# Patient Record
Sex: Female | Born: 1935 | Race: White | Hispanic: No | Marital: Married | Smoking: Never smoker
Health system: Southern US, Community
[De-identification: ages and names within clinical notes are randomized; demographics above are authoritative.]

## PROBLEM LIST (undated history)

## (undated) DIAGNOSIS — M797 Fibromyalgia: Secondary | ICD-10-CM

## (undated) DIAGNOSIS — Z87442 Personal history of urinary calculi: Secondary | ICD-10-CM

## (undated) HISTORY — PX: BREAST SURGERY: SHX581

## (undated) HISTORY — PX: COLON SURGERY: SHX602

## (undated) HISTORY — PX: ABDOMINAL HYSTERECTOMY: SHX81

---

## 2002-05-12 ENCOUNTER — Other Ambulatory Visit: Admission: RE | Admit: 2002-05-12 | Discharge: 2002-05-12 | Payer: Self-pay | Admitting: Family Medicine

## 2004-01-28 ENCOUNTER — Emergency Department (HOSPITAL_COMMUNITY): Admission: EM | Admit: 2004-01-28 | Discharge: 2004-01-28 | Payer: Self-pay | Admitting: Family Medicine

## 2004-05-16 ENCOUNTER — Other Ambulatory Visit: Admission: RE | Admit: 2004-05-16 | Discharge: 2004-05-16 | Payer: Self-pay | Admitting: Family Medicine

## 2005-01-10 ENCOUNTER — Ambulatory Visit (HOSPITAL_COMMUNITY): Admission: RE | Admit: 2005-01-10 | Discharge: 2005-01-10 | Payer: Self-pay | Admitting: Family Medicine

## 2009-08-10 ENCOUNTER — Other Ambulatory Visit: Admission: RE | Admit: 2009-08-10 | Discharge: 2009-08-10 | Payer: Self-pay | Admitting: Family Medicine

## 2014-04-19 ENCOUNTER — Emergency Department (HOSPITAL_COMMUNITY): Payer: Medicare Other

## 2014-04-19 ENCOUNTER — Encounter (HOSPITAL_COMMUNITY): Payer: Self-pay | Admitting: Emergency Medicine

## 2014-04-19 ENCOUNTER — Emergency Department (HOSPITAL_COMMUNITY)
Admission: EM | Admit: 2014-04-19 | Discharge: 2014-04-19 | Disposition: A | Discharge status: Home / Self Care | Payer: Medicare Other | Attending: Emergency Medicine | Admitting: Emergency Medicine

## 2014-04-19 DIAGNOSIS — Z79899 Other long term (current) drug therapy: Secondary | ICD-10-CM | POA: Diagnosis not present

## 2014-04-19 DIAGNOSIS — Z8739 Personal history of other diseases of the musculoskeletal system and connective tissue: Secondary | ICD-10-CM | POA: Insufficient documentation

## 2014-04-19 DIAGNOSIS — R0789 Other chest pain: Secondary | ICD-10-CM | POA: Diagnosis not present

## 2014-04-19 DIAGNOSIS — F411 Generalized anxiety disorder: Secondary | ICD-10-CM | POA: Insufficient documentation

## 2014-04-19 DIAGNOSIS — F418 Other specified anxiety disorders: Secondary | ICD-10-CM

## 2014-04-19 HISTORY — DX: Fibromyalgia: M79.7

## 2014-04-19 LAB — BASIC METABOLIC PANEL
Anion gap: 16 — ABNORMAL HIGH (ref 5–15)
BUN: 14 mg/dL (ref 6–23)
CALCIUM: 10.1 mg/dL (ref 8.4–10.5)
CHLORIDE: 103 meq/L (ref 96–112)
CO2: 26 meq/L (ref 19–32)
Creatinine, Ser: 0.92 mg/dL (ref 0.50–1.10)
GFR calc Af Amer: 68 mL/min — ABNORMAL LOW (ref >90)
GFR, EST NON AFRICAN AMERICAN: 59 mL/min — AB (ref >90)
GLUCOSE: 100 mg/dL — AB (ref 70–99)
POTASSIUM: 4.3 meq/L (ref 3.7–5.3)
Sodium: 145 mEq/L (ref 137–147)

## 2014-04-19 LAB — I-STAT TROPONIN, ED: Troponin i, poc: 0 ng/mL (ref 0.00–0.08)

## 2014-04-19 LAB — CBC
HEMATOCRIT: 41 % (ref 36.0–46.0)
Hemoglobin: 13.7 g/dL (ref 12.0–15.0)
MCH: 30.7 pg (ref 26.0–34.0)
MCHC: 33.4 g/dL (ref 30.0–36.0)
MCV: 91.9 fL (ref 78.0–100.0)
Platelets: 257 10*3/uL (ref 150–400)
RBC: 4.46 MIL/uL (ref 3.87–5.11)
RDW: 12.9 % (ref 11.5–15.5)
WBC: 6.2 10*3/uL (ref 4.0–10.5)

## 2014-04-19 MED ORDER — LORAZEPAM 0.5 MG PO TABS: 0.5000 mg | ORAL_TABLET | Freq: Three times a day (TID) | ORAL | Status: DC

## 2014-04-19 MED ORDER — LORAZEPAM 1 MG PO TABS
0.5000 mg | ORAL_TABLET | Freq: Once | ORAL | Status: AC
  Administered 2014-04-19: 0.5 mg via ORAL
  Filled 2014-04-19: qty 1

## 2014-04-19 NOTE — ED Notes (Signed)
Pt c/o generalized cp that started yesterday and worsened today. sts it feels like heaviness. Pt sts she thinks it is mainly her fibromyalgia and anxiety. Her husband is about to be taken off life support shortly and is just under a lot of stress. Nad, skin warm and dry, resp e/u.

## 2014-04-19 NOTE — ED Provider Notes (Signed)
CSN: [id number]     Arrival date & time 04/19/14  1207 History   First MD Initiated Contact with Patient 04/19/14 1518     Chief Complaint  Patient presents with  . Chest Pain  . Anxiety     (Consider location/radiation/quality/duration/timing/severity/associated sxs/prior Treatment) HPI  Joyce Ray is a 79 y.o. female who states that she has had chest heaviness, constantly, since yesterday, related to distress over her husband, who is dying currently. His ventilator was discontinued yesterday and is breathing on his own; however, he is comatose and near death. She denies fever, chills, cough, diaphoresis, nausea, vomiting, weakness, or dizziness. She has not had this previously. She does not have known cardiac disease. She's taking her usual medicines, without relief. There are no other known modifying factors.   Past Medical History  Diagnosis Date  . Fibromyalgia    Past Surgical History  Procedure Laterality Date  . Abdominal hysterectomy     No family history on file. History  Substance Use Topics  . Smoking status: Never Smoker   . Smokeless tobacco: Not on file  . Alcohol Use: No   OB History   Grav Para Term Preterm Abortions TAB SAB Ect Mult Living                 Review of Systems  All other systems reviewed and are negative.     Allergies  Review of patient's allergies indicates no known allergies.  Home Medications   Prior to Admission medications   Medication Sig Start Date End Date Taking? Authorizing Provider  LORazepam (ATIVAN) 0.5 MG tablet Take 1 tablet (0.5 mg total) by mouth every 8 (eight) hours. 04/19/14   Richarda Blade, MD   BP 118/68  Temp(Src) 98.2 F (36.8 C) (Oral)  Resp 18  Ht 5\' 8"  (1.727 m)  Wt 170 lb (77.111 kg)  BMI 25.85 kg/m2  SpO2 100% Physical Exam  Nursing note and vitals reviewed. Constitutional: She is oriented to person, place, and time. She appears well-developed and well-nourished.  HENT:  Head:  Normocephalic and atraumatic.  Eyes: Conjunctivae and EOM are normal. Pupils are equal, round, and reactive to light.  Neck: Normal range of motion and phonation normal. Neck supple.  Cardiovascular: Normal rate, regular rhythm and intact distal pulses.   Pulmonary/Chest: Effort normal and breath sounds normal. No respiratory distress. She exhibits no tenderness.  Abdominal: Soft. She exhibits no distension. There is no tenderness. There is no guarding.  Musculoskeletal: Normal range of motion.  Neurological: She is alert and oriented to person, place, and time. She exhibits normal muscle tone.  Skin: Skin is warm and dry.  Psychiatric: Her behavior is normal. Judgment and thought content normal.  Mood is consistent with grief reaction    ED Course  Procedures (including critical care time)  Medications  LORazepam (ATIVAN) tablet 0.5 mg (not administered)    Patient Vitals for the past 24 hrs:  BP Temp Temp src Resp SpO2 Height Weight  04/19/14 1221 118/68 mmHg 98.2 F (36.8 C) Oral 18 100 % 5\' 8"  (1.727 m) 170 lb (77.111 kg)    Findings discussed with patient and family member, all questions answered.    Labs Review Labs Reviewed  BASIC METABOLIC PANEL - Abnormal; Notable for the following:    Glucose, Bld 100 (*)    GFR calc non Af Amer 59 (*)    GFR calc Af Amer 68 (*)    Anion gap 16 (*)  All other components within normal limits  CBC  I-STAT TROPOININ, ED    Imaging Review Dg Chest 2 View  04/19/2014   CLINICAL DATA:  Chest pressure  EXAM: CHEST  2 VIEW  COMPARISON:  07/10/2008  FINDINGS: Cardiac shadow is within normal limits. The lungs are well aerated bilaterally without focal infiltrate or sizable effusion. A mildly radiopaque lead is noted over the anterior aspect of the right second rib. No acute bony abnormality is noted.  IMPRESSION: No acute abnormality seen.   Electronically Signed   By: Inez Catalina M.D.   On: 04/19/2014 13:50     EKG  Interpretation None      MDM   Final diagnoses:  Situational anxiety    Situational inside the and grief reaction. No other associated problems are identified.  Nursing Notes Reviewed/ Care Coordinated Applicable Imaging Reviewed Interpretation of Laboratory Data incorporated into ED treatment  The patient appears reasonably screened and/or stabilized for discharge and I doubt any other medical condition or other [facility] requiring further screening, evaluation, or treatment in the ED at this time prior to discharge.  Plan: Home Medications- Ativan when necessary; Home Treatments- rest, expected grieving process to proceed; return here if the recommended treatment, does not improve the symptoms; Recommended follow up- PCP, when necessary     Richarda Blade, MD 04/19/14 [phone]

## 2014-10-06 ENCOUNTER — Emergency Department (HOSPITAL_COMMUNITY): Payer: Medicare Other

## 2014-10-06 ENCOUNTER — Encounter (HOSPITAL_COMMUNITY): Payer: Self-pay | Admitting: *Deleted

## 2014-10-06 ENCOUNTER — Emergency Department (HOSPITAL_COMMUNITY)
Admission: EM | Admit: 2014-10-06 | Discharge: 2014-10-07 | Disposition: A | Discharge status: Home / Self Care | Payer: Medicare Other | Attending: Emergency Medicine | Admitting: Emergency Medicine

## 2014-10-06 DIAGNOSIS — Y92009 Unspecified place in unspecified non-institutional (private) residence as the place of occurrence of the external cause: Secondary | ICD-10-CM | POA: Diagnosis not present

## 2014-10-06 DIAGNOSIS — W1839XA Other fall on same level, initial encounter: Secondary | ICD-10-CM | POA: Diagnosis not present

## 2014-10-06 DIAGNOSIS — Z8739 Personal history of other diseases of the musculoskeletal system and connective tissue: Secondary | ICD-10-CM | POA: Insufficient documentation

## 2014-10-06 DIAGNOSIS — S8992XA Unspecified injury of left lower leg, initial encounter: Secondary | ICD-10-CM

## 2014-10-06 DIAGNOSIS — Z79899 Other long term (current) drug therapy: Secondary | ICD-10-CM | POA: Insufficient documentation

## 2014-10-06 DIAGNOSIS — S82832A Other fracture of upper and lower end of left fibula, initial encounter for closed fracture: Secondary | ICD-10-CM | POA: Insufficient documentation

## 2014-10-06 DIAGNOSIS — W19XXXA Unspecified fall, initial encounter: Secondary | ICD-10-CM

## 2014-10-06 DIAGNOSIS — Y9389 Activity, other specified: Secondary | ICD-10-CM | POA: Insufficient documentation

## 2014-10-06 DIAGNOSIS — S82402A Unspecified fracture of shaft of left fibula, initial encounter for closed fracture: Secondary | ICD-10-CM

## 2014-10-06 DIAGNOSIS — Y998 Other external cause status: Secondary | ICD-10-CM | POA: Diagnosis not present

## 2014-10-06 MED ORDER — HYDROCODONE-ACETAMINOPHEN 5-325 MG PO TABS
1.0000 | ORAL_TABLET | Freq: Once | ORAL | Status: AC
  Administered 2014-10-06: 1 via ORAL
  Filled 2014-10-06: qty 1

## 2014-10-06 NOTE — ED Notes (Signed)
Pt reports slipped and fell on her porch.  Reports she laid there for a while until she was able to get herself up and crawled in her house.  Pt reports L hip pain radiating down to her L foot.

## 2014-10-06 NOTE — ED Notes (Signed)
Dr. Aline Brochure and orthotech at [facility]. Family x2 at [facility]. Pt alert, NAD, calm, interactive.

## 2014-10-06 NOTE — ED Provider Notes (Signed)
CSN: [id number]     Arrival date & time 10/06/14  1956 History   First MD Initiated Contact with Patient 10/06/14 2236     Chief Complaint  Patient presents with  . Fall     (Consider location/radiation/quality/duration/timing/severity/associated sxs/prior Treatment) HPI  This is a 80 year old female who presents emergency Department with chief complaint of left leg pain after mechanical fall. Patient states that she is coming in to her house when she rolled her left ankle. She is immediate severe pain in her left knee and upper leg. She fell to the ground and was unable to ambulate due to her pain. Patient states she was able to get up eventually and unlock her door and get herself into the house. She denies any numbness or tingling in the foot. She is complaining of pain which she describes as constant, pressure-like, throbbing from the knee down to the foot. She denies history of DVT. She denies paresthesias. Past Medical History  Diagnosis Date  . Fibromyalgia    Past Surgical History  Procedure Laterality Date  . Abdominal hysterectomy     No family history on file. History  Substance Use Topics  . Smoking status: Never Smoker   . Smokeless tobacco: Not on file  . Alcohol Use: No   OB History    No data available     Review of Systems  Ten systems reviewed and are negative for acute change, except as noted in the HPI.    Allergies  Darvon  Home Medications   Prior to Admission medications   Medication Sig Start Date End Date Taking? Authorizing Provider  LORazepam (ATIVAN) 0.5 MG tablet Take 1 tablet (0.5 mg total) by mouth every 8 (eight) hours. 04/19/14   Richarda Blade, MD   BP 143/66 mmHg  Pulse 86  Temp(Src) 98 F (36.7 C) (Oral)  Resp 20  SpO2 100% Physical Exam  Constitutional: She is oriented to person, place, and time. She appears well-developed and well-nourished. No distress.  HENT:  Head: Normocephalic and atraumatic.  Eyes: Conjunctivae are  normal. No scleral icterus.  Neck: Normal range of motion.  Cardiovascular: Normal rate, regular rhythm and normal heart sounds.  Exam reveals no gallop and no friction rub.   No murmur heard. Pulmonary/Chest: Effort normal and breath sounds normal. No respiratory distress.  Abdominal: Soft. Bowel sounds are normal. She exhibits no distension and no mass. There is no tenderness. There is no guarding.  Musculoskeletal:       Legs: Swelling of the Left knee with Point tenderness along the proximal fibula. Left knee is tender along the joint line. She is unable to move th knee joint without pain. Ligaments are stable. The R ankle is without pain, swelling or tenderness. There is no laxity in the ankle joint mortise and no widening is noted.   Neurological: She is alert and oriented to person, place, and time.  Skin: Skin is warm and dry. She is not diaphoretic.    ED Course  Procedures (including critical care time) Labs Review Labs Reviewed - No data to display  Imaging Review Dg Knee 1-2 Views Left  10/06/2014   CLINICAL DATA:  Status post fall, with pain extending from the midthigh to the left ankle. Initial encounter.  EXAM: LEFT KNEE - 1-2 VIEW  COMPARISON:  None.  FINDINGS: There is a mildly comminuted and mildly displaced fracture at the proximal fibular diaphysis, with mild lateral and anterior displacement.  No definite additional fracture is seen,  though there is a small to moderate knee joint effusion. There is question of mild flattening of the lateral tibial plateau on a single view. Underlying trabecular bone injury cannot be excluded; would correlate for associated knee symptoms, and consider MRI as deemed clinically appropriate.  An enthesophyte is seen at the upper pole of the patella. No significant degenerative change is otherwise seen. The joint spaces are preserved.  There is suggestion of mild soft tissue edema overlying the lateral compartment of the knee.  IMPRESSION: 1.  Mildly comminuted and mildly displaced fracture of the proximal fibular diaphysis, with mild lateral and anterior displacement. Would correlate for ankle symptoms, and consider ankle radiographs for further evaluation. 2. Small to moderate knee joint effusion noted. No definite additional fracture seen, though there is question of mild flattening of the lateral tibial plateau on a single view. Underlying trabecular bone injury cannot be excluded; would correlate for associated symptoms, and consider MRI as deemed clinically appropriate.   Electronically Signed   By: Garald Balding M.D.   On: 10/06/2014 21:28   Dg Hip Unilat With Pelvis 2-3 Views Left  10/06/2014   CLINICAL DATA:  Initial encounter for fall today coming through front door. Unable to bear weight.  EXAM: DG HIP W/ PELVIS 2-3V*L*  COMPARISON:  None.  FINDINGS: Femoral heads are located. Sacroiliac joints are symmetric. 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 Mild degenerate changes of the left symphysis pubis. No acute fracture.  IMPRESSION: Degenerative change, without acute osseous finding.   Electronically Signed   By: Cherity Blickenstaff Miyamoto M.D.   On: 10/06/2014 21:25     EKG Interpretation None      MDM   Final diagnoses:  Fall at home, initial encounter  Closed fibular fracture, left, initial encounter  Knee injury, left, initial encounter   Patient with a proximal fibular fracture of the left fibula. She has a knee effusion and I suspect possible internal derangement of the knee. No fractures are noted. I do not suspect any interosseous membrane tear as there is no widening of the left ankle mortise. Her sensation and pulses are intact.. Patient placed in splint and made non-weight bearing. She is advised to follow-up with orthopedics.  Patient seen in shared visit with Dr. Pamella Pert.   Margarita Mail, PA-C 10/09/14 [place], MD 10/10/14 [phone]

## 2014-10-06 NOTE — ED Notes (Addendum)
Alert, NAD, calm, interactive, denies sx other than pain. Family x2 at [facility]. Pt mentions living alone.

## 2014-10-06 NOTE — ED Notes (Signed)
Bed: HQ46 Expected date:  Expected time:  Means of arrival:  Comments: EMS overdose tramadol

## 2014-10-07 MED ORDER — HYDROCODONE-ACETAMINOPHEN 5-325 MG PO TABS: 1.0000 | ORAL_TABLET | Freq: Four times a day (QID) | ORAL | Status: DC | PRN

## 2014-10-07 NOTE — ED Notes (Signed)
Out in w/c by RN to car with family x2, no changes, L knee immobilizer in place. Given Rx x1. Denies needs sx concerns or questions unmet.

## 2014-10-07 NOTE — Discharge Instructions (Signed)
Fall Prevention and Home Safety Falls cause injuries and can affect all age groups. It is possible to use preventive measures to significantly decrease the likelihood of falls. There are many simple measures which can make your home safer and prevent falls. OUTDOORS  Repair cracks and edges of walkways and driveways.  Remove high doorway thresholds.  Trim shrubbery on the main path into your home.  Have good outside lighting.  Clear walkways of tools, rocks, debris, and clutter.  Check that handrails are not broken and are securely fastened. Both sides of steps should have handrails.  Have leaves, snow, and ice cleared regularly.  Use sand or salt on walkways during winter months.  In the garage, clean up grease or oil spills. BATHROOM  Install night lights.  Install grab bars by the toilet and in the tub and shower.  Use non-skid mats or decals in the tub or shower.  Place a plastic non-slip stool in the shower to sit on, if needed.  Keep floors dry and clean up all water on the floor immediately.  Remove soap buildup in the tub or shower on a regular basis.  Secure bath mats with non-slip, double-sided rug tape.  Remove throw rugs and tripping hazards from the floors. BEDROOMS  Install night lights.  Make sure a bedside light is easy to reach.  Do not use oversized bedding.  Keep a telephone by your bedside.  Have a firm chair with side arms to use for getting dressed.  Remove throw rugs and tripping hazards from the floor. KITCHEN  Keep handles on pots and pans turned toward the center of the stove. Use back burners when possible.  Clean up spills quickly and allow time for drying.  Avoid walking on wet floors.  Avoid hot utensils and knives.  Position shelves so they are not too high or low.  Place commonly used objects within easy reach.  If necessary, use a sturdy step stool with a grab bar when reaching.  Keep electrical cables out of the  way.  Do not use floor polish or wax that makes floors slippery. If you must use wax, use non-skid floor wax.  Remove throw rugs and tripping hazards from the floor. STAIRWAYS  Never leave objects on stairs.  Place handrails on both sides of stairways and use them. Fix any loose handrails. Make sure handrails on both sides of the stairways are as long as the stairs.  Check carpeting to make sure it is firmly attached along stairs. Make repairs to worn or loose carpet promptly.  Avoid placing throw rugs at the top or bottom of stairways, or properly secure the rug with carpet tape to prevent slippage. Get rid of throw rugs, if possible.  Have an electrician put in a light switch at the top and bottom of the stairs. OTHER FALL PREVENTION TIPS  Wear low-heel or rubber-soled shoes that are supportive and fit well. Wear closed toe shoes.  When using a stepladder, make sure it is fully opened and both spreaders are firmly locked. Do not climb a closed stepladder.  Add color or contrast paint or tape to grab bars and handrails in your home. Place contrasting color strips on first and last steps.  Learn and use mobility aids as needed. Install an electrical emergency response system.  Turn on lights to avoid dark areas. Replace light bulbs that burn out immediately. Get light switches that glow.  Arrange furniture to create clear pathways. Keep furniture in the same place.  Firmly attach carpet with non-skid or double-sided tape.  Eliminate uneven floor surfaces.  Select a carpet pattern that does not visually hide the edge of steps.  Be aware of all pets. OTHER HOME SAFETY TIPS  Set the water temperature for 120 F (48.8 C).  Keep emergency numbers on or near the telephone.  Keep smoke detectors on every level of the home and near sleeping areas. Document Released: 08/29/2002 Document Revised: 03/09/2012 Document Reviewed: 11/28/2011 [facility] Patient Information 2015  [place], [place]. This information is not intended to replace advice given to you by your health care provider. Make sure you discuss any questions you have with your health care provider.  Tibial and Fibular Fracture, Adult Tibial and fibular fracture is a break in the bones of your lower leg (tibia and fibula). The tibia is the larger of these two bones. The fibula is the smaller of the two bones. It is on the outer side of your leg.  CAUSES  Low-energy injuries, such as a fall from ground level.  High-energy injuries, such as motor vehicle injuries, gunshot wounds, or high-speed sports collisions. RISK FACTORS  Jumping activities.  Repetitive stress, such as long-distance running.  Participation in sports.  Osteoporosis.  Advanced age. SIGNS AND SYMPTOMS  Pain.  Swelling.  Inability to put weight on your injured leg.  Bone deformities at the site of your injury.  Bruising. DIAGNOSIS  Tibial and fibular fractures are diagnosed with the use of X-ray exams. TREATMENT  If you have a simple fracture of these two bones, they can be treated with simple immobilization. A cast or splint will be used on your leg to keep it from moving while it heals. Then you can begin range-of-motion exercises to regain your knee motion. HOME CARE INSTRUCTIONS   Apply ice to your leg:  Put ice in a plastic bag.  Place a towel between your skin and the bag.  Leave the ice on for 20 minutes, 2-3 times a day.  If you have a plaster or fiberglass cast:  Do not try to scratch the skin under the cast using sharp or pointed objects.  Check the skin around the cast every day. You may put lotion on any red or sore areas.  Keep your cast dry and clean.  If you have a plaster splint:  Wear the splint as directed.  You may loosen the elastic around the splint if your toes become numb, tingle, or turn cold or blue.  Do not put pressure on any part of your cast or splint until it is fully  hardened, because it may deform.  Your cast or splint can be protected during bathing with a plastic bag. Do not lower the cast or splint into water.  Use crutches as directed.  Only take over-the-counter or prescription medicines for pain, discomfort, or fever as directed by your health care provider.  Follow all instructions given to you by your health care provider.  Make and keep all follow-up appointments. SEEK MEDICAL CARE IF:  Your pain is becoming worse rather than better or is not controlled with medicines.  You have increased swelling or redness in the foot.  You begin to lose feeling in your foot or toes. SEEK IMMEDIATE MEDICAL CARE IF:  You develop a cold or blue foot or toes on the injured side.  You develop severe pain in your injured leg, especially if the pain is increased with movement of your toes. MAKE SURE YOU:  Understand these instructions.  Will watch your condition.  Will get help right away if you are not doing well or get worse. Document Released: 05/31/2002 Document Revised: 06/29/2013 Document Reviewed: 04/20/2013 [facility] Patient Information 2015 [place], [place]. This information is not intended to replace advice given to you by your health care provider. Make sure you discuss any questions you have with your health care provider.

## 2014-10-23 ENCOUNTER — Other Ambulatory Visit: Payer: Self-pay | Admitting: Orthopaedic Surgery

## 2014-10-23 DIAGNOSIS — M25562 Pain in left knee: Secondary | ICD-10-CM

## 2014-10-26 ENCOUNTER — Ambulatory Visit
Admission: RE | Admit: 2014-10-26 | Discharge: 2014-10-26 | Disposition: A | Discharge status: Home / Self Care | Payer: Medicare Other | Source: Ambulatory Visit | Attending: Orthopaedic Surgery | Admitting: Orthopaedic Surgery

## 2014-10-26 DIAGNOSIS — M25562 Pain in left knee: Secondary | ICD-10-CM

## 2018-01-06 ENCOUNTER — Other Ambulatory Visit: Payer: Self-pay | Admitting: Family Medicine

## 2018-01-06 ENCOUNTER — Ambulatory Visit
Admission: RE | Admit: 2018-01-06 | Discharge: 2018-01-06 | Disposition: A | Discharge status: Home / Self Care | Payer: Medicare Other | Source: Ambulatory Visit | Attending: Family Medicine | Admitting: Family Medicine

## 2018-01-06 DIAGNOSIS — J0110 Acute frontal sinusitis, unspecified: Secondary | ICD-10-CM

## 2018-07-15 ENCOUNTER — Encounter (INDEPENDENT_AMBULATORY_CARE_PROVIDER_SITE_OTHER): Payer: Self-pay | Admitting: Orthopaedic Surgery

## 2018-07-15 ENCOUNTER — Ambulatory Visit (INDEPENDENT_AMBULATORY_CARE_PROVIDER_SITE_OTHER): Payer: Self-pay

## 2018-07-15 ENCOUNTER — Ambulatory Visit (INDEPENDENT_AMBULATORY_CARE_PROVIDER_SITE_OTHER): Payer: Medicare Other | Admitting: Orthopaedic Surgery

## 2018-07-15 VITALS — BP 128/77 | HR 130 | Resp 18 | Ht 68.5 in | Wt 170.0 lb

## 2018-07-15 DIAGNOSIS — M25571 Pain in right ankle and joints of right foot: Secondary | ICD-10-CM | POA: Diagnosis not present

## 2018-07-15 DIAGNOSIS — M79671 Pain in right foot: Secondary | ICD-10-CM | POA: Diagnosis not present

## 2018-07-15 DIAGNOSIS — M25561 Pain in right knee: Secondary | ICD-10-CM

## 2018-07-15 DIAGNOSIS — M25551 Pain in right hip: Secondary | ICD-10-CM | POA: Diagnosis not present

## 2018-07-15 NOTE — Progress Notes (Signed)
Office Visit Note   Patient: Joyce Ray           Date of Birth: 03/12/1935           MRN: [id number] Visit Date: 07/15/2018              Requested by: Shirline Frees, MD [address] [place], [place] [id number] PCP: Shirline Frees, MD   Assessment & Plan: Visit Diagnoses:  1. Pain in right ankle and joints of right foot   2. Pain in right foot   3. Acute pain of right knee   4. Pain in right hip     Plan: Nondisplaced fractures of the proximal right and distal right fibula.  Will apply a splint.  Office early next week and consider equalizer boot.  Tylenol for pain.  Has walker at home with minimal weight to left lower extremity.  Follow-Up Instructions: Return in about 1 week (around 07/22/2018).   Orders:  Orders Placed This Encounter  Procedures  . XR Ankle Complete Right  . XR Foot 2 Views Right  . XR KNEE 3 VIEW RIGHT  . XR HIP UNILAT W OR W/O PELVIS 2-3 VIEWS RIGHT   No orders of the defined types were placed in this encounter.     Procedures: No procedures performed   Clinical Data: No additional findings.   Subjective: Chief Complaint  Patient presents with  . Right Ankle - Injury  . Ankle Pain    Right ankle pain, swelling, pain from foot to hip, fell at grocery store today, no surgery to ankle, not diabetic  Joyce Ray is 83 years old and sustained an injury at her home this afternoon.  She slipped and fell landing on her right side.  She had immediate onset of swelling along the lateral aspect of her right ankle with some pain along her right leg "as well".  She is brought to the office in a wheelchair.  No related trouble on the left side.  HPI  Review of Systems  Constitutional: Positive for fatigue.  HENT: Negative for trouble swallowing.   Eyes: Negative for pain.  Respiratory: Negative for shortness of breath.   Cardiovascular: Negative for leg swelling.  Gastrointestinal: Negative for constipation.  Endocrine:  Negative for cold intolerance.  Genitourinary: Negative for difficulty urinating.  Musculoskeletal: Positive for back pain, gait problem, joint swelling, neck pain and neck stiffness.  Skin: Negative for rash.  Allergic/Immunologic: Negative for food allergies.  Neurological: Positive for weakness and numbness.  Hematological: Does not bruise/bleed easily.  Psychiatric/Behavioral: Positive for sleep disturbance.     Objective: Vital Signs: BP 128/77 (BP Location: Left Arm, Patient Position: Sitting, Cuff Size: Normal)   Pulse (!) 130   Resp 18   Ht 5' 8.5" (1.74 m)   Wt 170 lb (77.1 kg)   BMI 25.47 kg/m   Physical Exam  Constitutional: She is oriented to person, place, and time. She appears well-developed and well-nourished.  HENT:  Mouth/Throat: Oropharynx is clear and moist.  Eyes: Pupils are equal, round, and reactive to light. EOM are normal.  Pulmonary/Chest: Effort normal.  Neurological: She is alert and oriented to person, place, and time.  Skin: Skin is warm and dry.  Psychiatric: She has a normal mood and affect. Her behavior is normal.    Ortho Exam awake alert and oriented x3.  Comfortable sitting.  Examination of the right ankle reveals moderate local swelling about the lateral tilt.  No obvious deformity.  Good capillary refill to toes.  Very small abrasion along the lateral aspect of her leg just proximal to the midline.  No knee pain or effusion.  Skin intact.  No thigh pain.  Very minimal discomfort over the greater trochanter right hip.  Straight leg raise negative.  No pain with percussion of the lumbar spine  Specialty Comments:  No specialty comments available.  Imaging: Xr Hip Unilat W Or W/o Pelvis 2-3 Views Right  Result Date: 07/15/2018 Films of the pelvis were negative for any acute changes.  Mild degenerative changes at both hip joints. no obvious acute changes in the right hemipelvis  Xr Ankle Complete Right  Result Date: 07/15/2018 Films of the  right ankle obtained in several projections.  There is a nondisplaced fracture of the distal fibula proximal to the ankle joint.  Ankle mortise intact without widening.  Some chronic changes and ectopic calcification along the deltoid ligament and the inferior aspect of the medial malleolus.  Xr Foot 2 Views Right  Result Date: 07/15/2018 Films of the right foot were negative for any fracture  Xr Knee 3 View Right  Result Date: 07/15/2018 Films of the right leg including the knee revealed a nondisplaced fracture at the junction of the proximal and middle thirds of the fibula.  No displacement.  No acute changes about the right knee    PMFS History: There are no active problems to display for this patient.  Past Medical History:  Diagnosis Date  . Fibromyalgia     History reviewed. No pertinent family history.  Past Surgical History:  Procedure Laterality Date  . ABDOMINAL HYSTERECTOMY    . BREAST SURGERY    . COLON SURGERY     Social History   Occupational History  . Not on file  Tobacco Use  . Smoking status: Never Smoker  . Smokeless tobacco: Never Used  Substance and Sexual Activity  . Alcohol use: No  . Drug use: No  . Sexual activity: Not on file

## 2018-07-20 ENCOUNTER — Ambulatory Visit
Admission: RE | Admit: 2018-07-20 | Discharge: 2018-07-20 | Disposition: A | Discharge status: Home / Self Care | Payer: Medicare Other | Source: Ambulatory Visit | Attending: Orthopaedic Surgery | Admitting: Orthopaedic Surgery

## 2018-07-20 ENCOUNTER — Ambulatory Visit (INDEPENDENT_AMBULATORY_CARE_PROVIDER_SITE_OTHER): Payer: Medicare Other | Admitting: Orthopaedic Surgery

## 2018-07-20 ENCOUNTER — Encounter (INDEPENDENT_AMBULATORY_CARE_PROVIDER_SITE_OTHER): Payer: Self-pay | Admitting: Orthopaedic Surgery

## 2018-07-20 VITALS — BP 128/94 | HR 78 | Ht 68.5 in | Wt 170.0 lb

## 2018-07-20 DIAGNOSIS — M79661 Pain in right lower leg: Secondary | ICD-10-CM

## 2018-07-20 DIAGNOSIS — M79671 Pain in right foot: Secondary | ICD-10-CM

## 2018-07-20 NOTE — Progress Notes (Signed)
Office Visit Note   Patient: Joyce Ray           Date of Birth: Mar 21, 1935           MRN: [id number] Visit Date: 07/20/2018              Requested by: Shirline Frees, MD [place], [place] [address] PCP: Shirline Frees, MD   Assessment & Plan: Visit Diagnoses:  1. Right calf pain   2. Right foot pain     Plan: 5 days status post nondisplaced right distal fibula fracture.  Initially placed in a posterior splint.  Still having some pain but swelling is significantly decreased.  Will now placed an equalizer boot and limit weightbearing.  Having some calf pain.  Will order Doppler study.  Office 2 weeks Follow-Up Instructions: Return in about 2 weeks (around 08/03/2018).   Orders:  No orders of the defined types were placed in this encounter.  No orders of the defined types were placed in this encounter.     Procedures: No procedures performed   Clinical Data: No additional findings.   Subjective: Chief Complaint  Patient presents with  . Follow-up    R ANKLE PAIN F/U STILL HAVING TROUBLE SLEEPING,   Joyce Ray was seen 5 days ago for evaluation of acute injury to her right ankle.  She had fallen with immediate onset of pain and swelling of the ankle.  Films demonstrate a nondisplaced fracture of the distal fibula.  Because of her swelling she was placed in a posterior splint she is been doing well.  She is been nonweightbearing using a posterior splint and a walker.  She has experienced some calf pain but no shortness of breath or chest pain  HPI  Review of Systems  Constitutional: Positive for fatigue. Negative for fever.  HENT: Negative for ear pain.   Eyes: Negative for pain.  Respiratory: Negative for cough and shortness of breath.   Cardiovascular: Positive for leg swelling.  Gastrointestinal: Negative for constipation and diarrhea.  Genitourinary: Negative for difficulty urinating.  Musculoskeletal: Positive for back pain and  neck pain.  Skin: Negative for rash.  Allergic/Immunologic: Negative for food allergies.  Neurological: Positive for weakness and numbness.  Hematological: Does not bruise/bleed easily.  Psychiatric/Behavioral: Positive for sleep disturbance.     Objective: Vital Signs: BP (!) 128/94 (BP Location: Right Arm, Patient Position: Sitting, Cuff Size: Normal)   Pulse 78   Ht 5' 8.5" (1.74 m)   Wt 170 lb (77.1 kg)   BMI 25.47 kg/m   Physical Exam  Constitutional: She is oriented to person, place, and time. She appears well-developed and well-nourished.  HENT:  Mouth/Throat: Oropharynx is clear and moist.  Eyes: Pupils are equal, round, and reactive to light. EOM are normal.  Pulmonary/Chest: Effort normal.  Neurological: She is alert and oriented to person, place, and time.  Skin: Skin is warm and dry.  Psychiatric: She has a normal mood and affect. Her behavior is normal.    Ortho Exam evaluated in the wheelchair.  The splint was removed.  Still has resolving ecchymosis of the mid tibia to the dorsum of the right foot.  Swelling about the ankle is significantly decreased.  Good capillary refill.  Little area of pressure on the dorsum of her foot that seems to be superficial.  Has diffuse calf pain  Specialty Comments:  No specialty comments available.  Imaging: No results found.   PMFS History: There are  no active problems to display for this patient.  Past Medical History:  Diagnosis Date  . Fibromyalgia     History reviewed. No pertinent family history.  Past Surgical History:  Procedure Laterality Date  . ABDOMINAL HYSTERECTOMY    . BREAST SURGERY    . COLON SURGERY     Social History   Occupational History  . Not on file  Tobacco Use  . Smoking status: Never Smoker  . Smokeless tobacco: Never Used  Substance and Sexual Activity  . Alcohol use: No  . Drug use: No  . Sexual activity: Not on file

## 2018-07-30 ENCOUNTER — Ambulatory Visit (INDEPENDENT_AMBULATORY_CARE_PROVIDER_SITE_OTHER): Payer: Medicare Other | Admitting: Orthopaedic Surgery

## 2018-07-30 ENCOUNTER — Encounter (INDEPENDENT_AMBULATORY_CARE_PROVIDER_SITE_OTHER): Payer: Self-pay | Admitting: Orthopaedic Surgery

## 2018-07-30 ENCOUNTER — Ambulatory Visit (INDEPENDENT_AMBULATORY_CARE_PROVIDER_SITE_OTHER): Payer: Self-pay

## 2018-07-30 VITALS — BP 125/79 | HR 88 | Ht 68.5 in | Wt 170.0 lb

## 2018-07-30 DIAGNOSIS — M25571 Pain in right ankle and joints of right foot: Secondary | ICD-10-CM | POA: Diagnosis not present

## 2018-07-30 NOTE — Progress Notes (Signed)
Office Visit Note   Patient: Joyce Ray           Date of Birth: 11/08/1934           MRN: [id number] Visit Date: 07/30/2018              Requested by: Shirline Frees, MD [place], [place] [address] PCP: Shirline Frees, MD   Assessment & Plan: Visit Diagnoses:  1. Pain in right ankle and joints of right foot     Plan: 2 weeks status post right ankle fracture with limited weightbearing in an equalizer boot.  Films today revealed no change in position.  We will continue to treat nonoperatively with limited weightbearing and walker.  Return to the office in 3 weeks  Follow-Up Instructions: Return in about 3 weeks (around 08/20/2018).   Orders:  Orders Placed This Encounter  Procedures  . XR Ankle Complete Right   No orders of the defined types were placed in this encounter.     Procedures: No procedures performed   Clinical Data: No additional findings.   Subjective: Chief Complaint  Patient presents with  . Follow-up    2 WEEK F/U R ANKLE DOING OK JUST TIRED     HPI  Review of Systems  Constitutional: Positive for fatigue. Negative for fever.  HENT: Negative for ear pain.   Eyes: Negative for pain.  Respiratory: Negative for cough and shortness of breath.   Cardiovascular: Positive for leg swelling.  Gastrointestinal: Negative for constipation and diarrhea.  Genitourinary: Negative for difficulty urinating.  Musculoskeletal: Negative for back pain and neck pain.  Skin: Negative for rash.  Allergic/Immunologic: Negative for food allergies.  Neurological: Positive for weakness. Negative for numbness.  Hematological: Does not bruise/bleed easily.  Psychiatric/Behavioral: Positive for sleep disturbance.     Objective: Vital Signs: BP 125/79 (BP Location: Left Arm, Patient Position: Sitting, Cuff Size: Normal)   Pulse 88   Ht 5' 8.5" (1.74 m)   Wt 170 lb (77.1 kg)   BMI 25.47 kg/m   Physical Exam  Constitutional: She  is oriented to person, place, and time. She appears well-developed and well-nourished.  HENT:  Mouth/Throat: Oropharynx is clear and moist.  Eyes: Pupils are equal, round, and reactive to light. EOM are normal.  Pulmonary/Chest: Effort normal.  Neurological: She is alert and oriented to person, place, and time.  Skin: Skin is warm and dry.  Psychiatric: She has a normal mood and affect. Her behavior is normal.    Ortho Exam right ankle evaluated out of the boot.  Still has some swelling in the lateral ankle in the area of the fibula fracture.  Resolving ecchymosis.  Skin otherwise intact.  Motor and sensory exam intact.  No obvious deformity  Specialty Comments:  No specialty comments available.  Imaging: Xr Ankle Complete Right  Result Date: 07/30/2018 Films of the right ankle obtained in several projections are out of the equalizer boot.  No change in position of the distal fibula fracture in near anatomical alignment.  Ankle joint intact    PMFS History: There are no active problems to display for this patient.  Past Medical History:  Diagnosis Date  . Fibromyalgia     History reviewed. No pertinent family history.  Past Surgical History:  Procedure Laterality Date  . ABDOMINAL HYSTERECTOMY    . BREAST SURGERY    . COLON SURGERY     Social History   Occupational History  . Not on  file  Tobacco Use  . Smoking status: Never Smoker  . Smokeless tobacco: Never Used  Substance and Sexual Activity  . Alcohol use: No  . Drug use: No  . Sexual activity: Not on file

## 2018-08-02 ENCOUNTER — Ambulatory Visit (INDEPENDENT_AMBULATORY_CARE_PROVIDER_SITE_OTHER): Payer: Medicare Other | Admitting: Orthopaedic Surgery

## 2018-08-23 ENCOUNTER — Encounter (INDEPENDENT_AMBULATORY_CARE_PROVIDER_SITE_OTHER): Payer: Self-pay | Admitting: Orthopaedic Surgery

## 2018-08-23 ENCOUNTER — Ambulatory Visit (INDEPENDENT_AMBULATORY_CARE_PROVIDER_SITE_OTHER): Payer: Medicare Other | Admitting: Orthopaedic Surgery

## 2018-08-23 DIAGNOSIS — M79671 Pain in right foot: Secondary | ICD-10-CM

## 2018-08-23 NOTE — Progress Notes (Signed)
   Office Visit Note   Patient: Joyce Ray           Date of Birth: 1935/08/18           MRN: [id number] Visit Date: 08/23/2018              Requested by: Shirline Frees, MD [place] [place], [place] [address] PCP: Shirline Frees, MD   Assessment & Plan: Visit Diagnoses:  1. Right foot pain     Plan: Nearly 6 weeks status post nondisplaced transverse fracture of the distal right fibula and doing well.  Full weightbearing in the equalizer boot.  Will apply ASO ankle support with full weightbearing and return to see me as needed.  I think the fracture has healed based on her symptoms and exam.  No further films necessary  Follow-Up Instructions: Return if symptoms worsen or fail to improve.   Orders:  No orders of the defined types were placed in this encounter.  No orders of the defined types were placed in this encounter.     Procedures: No procedures performed   Clinical Data: No additional findings.   Subjective: Chief Complaint  Patient presents with  . Right Ankle - Follow-up, Fracture    DOI 07/15/18  Patient returns for follow up right ankle fracture. She is full weightbearing in the CAM boot. She denies pain along the side of her ankle, but states that she is having pain in her shin and her right knee. The right knee seems to hurt with movement.  The pain feels more like a "nervy"pain. Her foot does not hurt when she applies pressure, but if she flexes foot down, she does notice a "pulling" sensation.  She takes over the counter medicine as needed.   HPI  Review of Systems   Objective: Vital Signs: There were no vitals taken for this visit.  Physical Exam  Ortho Exam right ankle with some fluid wave in the area of the anterior talofibular fibulocalcaneal ligaments.  No pain over the distal fibula.  Excellent dorsiflexion plantarflexion of her foot and inversion eversion without much trouble.  Skin intact.  Neurologically  intact  Specialty Comments:  No specialty comments available.  Imaging: No results found.   PMFS History: There are no active problems to display for this patient.  Past Medical History:  Diagnosis Date  . Fibromyalgia     History reviewed. No pertinent family history.  Past Surgical History:  Procedure Laterality Date  . ABDOMINAL HYSTERECTOMY    . BREAST SURGERY    . COLON SURGERY     Social History   Occupational History  . Not on file  Tobacco Use  . Smoking status: Never Smoker  . Smokeless tobacco: Never Used  Substance and Sexual Activity  . Alcohol use: No  . Drug use: No  . Sexual activity: Not on file

## 2019-01-24 ENCOUNTER — Other Ambulatory Visit: Payer: Self-pay

## 2019-01-24 ENCOUNTER — Emergency Department (HOSPITAL_BASED_OUTPATIENT_CLINIC_OR_DEPARTMENT_OTHER)
Admission: EM | Admit: 2019-01-24 | Discharge: 2019-01-25 | Disposition: A | Discharge status: Home / Self Care | Payer: Medicare Other | Attending: Emergency Medicine | Admitting: Emergency Medicine

## 2019-01-24 ENCOUNTER — Encounter (HOSPITAL_BASED_OUTPATIENT_CLINIC_OR_DEPARTMENT_OTHER): Payer: Self-pay

## 2019-01-24 DIAGNOSIS — N23 Unspecified renal colic: Secondary | ICD-10-CM

## 2019-01-24 DIAGNOSIS — R112 Nausea with vomiting, unspecified: Secondary | ICD-10-CM | POA: Diagnosis present

## 2019-01-24 DIAGNOSIS — N201 Calculus of ureter: Secondary | ICD-10-CM | POA: Insufficient documentation

## 2019-01-24 DIAGNOSIS — Z79899 Other long term (current) drug therapy: Secondary | ICD-10-CM | POA: Diagnosis not present

## 2019-01-24 LAB — CBC WITH DIFFERENTIAL/PLATELET
Abs Immature Granulocytes: 0.05 10*3/uL (ref 0.00–0.07)
Basophils Absolute: 0.1 10*3/uL (ref 0.0–0.1)
Basophils Relative: 1 %
Eosinophils Absolute: 0.2 10*3/uL (ref 0.0–0.5)
Eosinophils Relative: 2 %
HCT: 42.7 % (ref 36.0–46.0)
Hemoglobin: 14.7 g/dL (ref 12.0–15.0)
Immature Granulocytes: 0 %
Lymphocytes Relative: 34 %
Lymphs Abs: 3.9 10*3/uL (ref 0.7–4.0)
MCH: 30.6 pg (ref 26.0–34.0)
MCHC: 34.4 g/dL (ref 30.0–36.0)
MCV: 89 fL (ref 80.0–100.0)
Monocytes Absolute: 0.9 10*3/uL (ref 0.1–1.0)
Monocytes Relative: 8 %
Neutro Abs: 6.4 10*3/uL (ref 1.7–7.7)
Neutrophils Relative %: 55 %
Platelets: 284 10*3/uL (ref 150–400)
RBC: 4.8 MIL/uL (ref 3.87–5.11)
RDW: 12.4 % (ref 11.5–15.5)
WBC: 11.7 10*3/uL — ABNORMAL HIGH (ref 4.0–10.5)
nRBC: 0 % (ref 0.0–0.2)

## 2019-01-24 MED ORDER — SODIUM CHLORIDE 0.9 % IV BOLUS
1000.0000 mL | Freq: Once | INTRAVENOUS | Status: AC
  Administered 2019-01-24: 1000 mL via INTRAVENOUS

## 2019-01-24 MED ORDER — METOCLOPRAMIDE HCL 5 MG/ML IJ SOLN
5.0000 mg | Freq: Once | INTRAMUSCULAR | Status: AC
  Administered 2019-01-24: 5 mg via INTRAVENOUS
  Filled 2019-01-24: qty 2

## 2019-01-24 MED ORDER — ONDANSETRON HCL 4 MG/2ML IJ SOLN
4.0000 mg | Freq: Once | INTRAMUSCULAR | Status: AC | PRN
  Administered 2019-01-24: 4 mg via INTRAVENOUS
  Filled 2019-01-24: qty 2

## 2019-01-24 NOTE — ED Triage Notes (Signed)
Pt has had N/V/D since 2 days ago. Pt also having abd pain. Pt currently on abx for sinus infection.

## 2019-01-24 NOTE — ED Provider Notes (Signed)
[facility] EMERGENCY DEPARTMENT Provider Note   CSN: [id number] Arrival date & time: 01/24/19  2250    History   Chief Complaint Chief Complaint  Patient presents with  . Emesis    HPI Joyce Ray is a 84 y.o. female.     Patient presents for evaluation of nausea, vomiting, diarrhea.  Symptoms present for 2 days.  She reports that she did not feel well for several days prior to that as well.  She is currently on an antibiotic, which she thinks is amoxicillin, for a sinus infection.  She has been feeling pain in the left upper abdomen and back area.  She has not noticed anything that changes or alleviates this pain.  She denies tenderness of the abdomen, however.  She thinks she might of pulled a muscle vomiting.  No hematemesis, melena or rectal bleeding.     Past Medical History:  Diagnosis Date  . Fibromyalgia     There are no active problems to display for this patient.   Past Surgical History:  Procedure Laterality Date  . ABDOMINAL HYSTERECTOMY    . BREAST SURGERY    . COLON SURGERY       OB History   No obstetric history on file.      Home Medications    Prior to Admission medications   Medication Sig Start Date End Date Taking? Authorizing Provider  amitriptyline (ELAVIL) 10 MG tablet TK 1 T PO BID 01/19/19   [provider]  amoxicillin (AMOXIL) 875 MG tablet TK 1 T PO Q 12 H FOR 10 DAYS 01/19/19   [provider]  HYDROcodone-acetaminophen (NORCO) 5-325 MG per tablet Take 1-2 tablets by mouth every 6 (six) hours as needed for moderate pain. Patient not taking: Reported on 07/15/2018 10/07/14   Margarita Mail, PA-C  LORazepam (ATIVAN) 0.5 MG tablet Take 1 tablet (0.5 mg total) by mouth every 8 (eight) hours. Patient not taking: Reported on 07/15/2018 04/19/14   Daleen Bo, MD  ondansetron (ZOFRAN) 4 MG tablet Take 1 tablet (4 mg total) by mouth every 6 (six) hours. 01/25/19   Orpah Greek, MD  ondansetron  (ZOFRAN) 8 MG tablet TK 1 T PO TID FOR 10 DAYS PRN 01/19/19   [provider]  oxyCODONE-acetaminophen (PERCOCET) 5-325 MG tablet Take 1 tablet by mouth every 4 (four) hours as needed. 01/25/19   Orpah Greek, MD  tamsulosin (FLOMAX) 0.4 MG CAPS capsule Take 1 capsule (0.4 mg total) by mouth daily. 01/25/19   Orpah Greek, MD    Family History No family history on file.  Social History Social History   Tobacco Use  . Smoking status: Never Smoker  . Smokeless tobacco: Never Used  Substance Use Topics  . Alcohol use: No  . Drug use: No     Allergies   Darvon [propoxyphene]   Review of Systems Review of Systems  Gastrointestinal: Positive for abdominal pain, diarrhea, nausea and vomiting.  All other systems reviewed and are negative.    Physical Exam Updated Vital Signs BP 140/76 (BP Location: Right Arm)   Pulse 70   Temp 98 F (36.7 C)   Resp 16   Ht 5' 8.5" (1.74 m)   Wt 77.1 kg   SpO2 100%   BMI 25.47 kg/m   Physical Exam Vitals signs and nursing note reviewed.  Constitutional:      General: She is not in acute distress.    Appearance: Normal appearance. She is well-developed.  HENT:     Head: Normocephalic and atraumatic.     Right Ear: Hearing normal.     Left Ear: Hearing normal.     Nose: Nose normal.  Eyes:     Conjunctiva/sclera: Conjunctivae normal.     Pupils: Pupils are equal, round, and reactive to light.  Neck:     Musculoskeletal: Normal range of motion and neck supple.  Cardiovascular:     Rate and Rhythm: Regular rhythm.     Heart sounds: S1 normal and S2 normal. No murmur. No friction rub. No gallop.   Pulmonary:     Effort: Pulmonary effort is normal. No respiratory distress.     Breath sounds: Normal breath sounds.  Chest:     Chest wall: No tenderness.  Abdominal:     General: Bowel sounds are normal.     Palpations: Abdomen is soft.     Tenderness: There is no abdominal tenderness. There is no guarding or  rebound. Negative signs include Murphy's sign and McBurney's sign.     Hernia: No hernia is present.  Musculoskeletal: Normal range of motion.  Skin:    General: Skin is warm and dry.     Findings: No rash.  Neurological:     Mental Status: She is alert and oriented to person, place, and time.     GCS: GCS eye subscore is 4. GCS verbal subscore is 5. GCS motor subscore is 6.     Cranial Nerves: No cranial nerve deficit.     Sensory: No sensory deficit.     Coordination: Coordination normal.  Psychiatric:        Speech: Speech normal.        Behavior: Behavior normal.        Thought Content: Thought content normal.      ED Treatments / Results  Labs (all labs ordered are listed, but only abnormal results are displayed) Labs Reviewed  URINALYSIS, ROUTINE W REFLEX MICROSCOPIC - Abnormal; Notable for the following components:      Result Value   APPearance HAZY (*)    Specific Gravity, Urine >1.030 (*)    Hgb urine dipstick LARGE (*)    Ketones, ur 15 (*)    Protein, ur 30 (*)    Leukocytes,Ua SMALL (*)    All other components within normal limits  CBC WITH DIFFERENTIAL/PLATELET - Abnormal; Notable for the following components:   WBC 11.7 (*)    All other components within normal limits  COMPREHENSIVE METABOLIC PANEL - Abnormal; Notable for the following components:   Potassium 3.2 (*)    Glucose, Bld 138 (*)    Creatinine, Ser 1.27 (*)    Total Protein 8.2 (*)    GFR calc non Af Amer 39 (*)    GFR calc Af Amer 46 (*)    All other components within normal limits  LIPASE, BLOOD - Abnormal; Notable for the following components:   Lipase 63 (*)    All other components within normal limits  URINALYSIS, MICROSCOPIC (REFLEX) - Abnormal; Notable for the following components:   Bacteria, UA RARE (*)    All other components within normal limits    EKG EKG Interpretation  Date/Time:  Monday Jan 24 2019 23:50:49 EDT Ventricular Rate:  69 PR Interval:    QRS Duration: 91 QT  Interval:  415 QTC Calculation: 445 R Axis:   28 Text Interpretation:  Sinus rhythm Prolonged PR interval Abnormal R-wave progression, early transition No previous tracing Confirmed by Orpah Greek (  [id number]) on 01/24/2019 11:59:26 PM   Radiology Ct Renal Stone Study  Result Date: 01/25/2019 CLINICAL DATA:  Abdominal pain, flank pain EXAM: CT ABDOMEN AND PELVIS WITHOUT CONTRAST TECHNIQUE: Multidetector CT imaging of the abdomen and pelvis was performed following the standard protocol without IV contrast. COMPARISON:  None. FINDINGS: Lower chest: Lung bases are clear. No effusions. Heart is normal size. Small hiatal hernia. Hepatobiliary: No focal hepatic abnormality. Gallbladder unremarkable. Pancreas: No focal abnormality or ductal dilatation. Spleen: No focal abnormality.  Normal size. Adrenals/Urinary Tract: Moderate left hydronephrosis and perinephric stranding due to 5 mm mid left ureteral stone. No stones or hydronephrosis on the right. Right parapelvic cysts. Adrenal glands and urinary bladder unremarkable. Stomach/Bowel: Left colonic diverticulosis. No active diverticulitis. Normal appendix. Stomach and small bowel decompressed, unremarkable. Vascular/Lymphatic: Aortic atherosclerosis. No enlarged abdominal or pelvic lymph nodes. Reproductive: Prior hysterectomy.  No adnexal masses. Other: No free fluid or free air. Musculoskeletal: No acute bony abnormality. IMPRESSION: 5 mm mid left ureteral stone with moderate left hydronephrosis and perinephric stranding. Aortic atherosclerosis. Small hiatal hernia. Left colonic diverticulosis. Electronically Signed   By: Rolm Baptise M.D.   On: 01/25/2019 00:23    Procedures Procedures (including critical care time)  Medications Ordered in ED Medications  ondansetron (ZOFRAN) injection 4 mg (4 mg Intravenous Given 01/24/19 2335)  sodium chloride 0.9 % bolus 1,000 mL ( Intravenous Stopped 01/25/19 0041)  metoCLOPramide (REGLAN) injection 5 mg (5 mg  Intravenous Given 01/24/19 2345)  morphine 4 MG/ML injection 4 mg (4 mg Intravenous Given 01/25/19 0010)  ketorolac (TORADOL) 30 MG/ML injection 15 mg (15 mg Intravenous Given 01/25/19 0054)     Initial Impression / Assessment and Plan / ED Course  I have reviewed the triage vital signs and the nursing notes.  Pertinent labs & imaging results that were available during my care of the patient were reviewed by me and considered in my medical decision making (see chart for details).        Patient presents to the emergency department for evaluation of left-sided abdominal pain with nausea and vomiting.  She has had diarrhea as well.  Patient reports that she is currently on amoxicillin for a sinus infection.  Patient reports the pain is in the left upper abdomen area and radiates around to the back.  She did not, however, have any tenderness on examination.  Patient had multiple episodes of emesis and dry heaving here in the ER.  Blood work was unremarkable.  Based on the pain, CT renal stone study was performed and did confirm a mid ureter stone on the left with hydronephrosis explaining her pain, nausea and vomiting.  Patient achieved good analgesia with medications given in the ER.  She will call urology in the morning, as a 5 mm mid ureter stone may not pass.  She was given return precautions.  Provided Flomax, Zofran, Percocet.  Final Clinical Impressions(s) / ED Diagnoses   Final diagnoses:  Renal colic on left side    ED Discharge Orders         Ordered    oxyCODONE-acetaminophen (PERCOCET) 5-325 MG tablet  Every 4 hours PRN     01/25/19 0215    tamsulosin (FLOMAX) 0.4 MG CAPS capsule  Daily     01/25/19 0215    ondansetron (ZOFRAN) 4 MG tablet  Every 6 hours     01/25/19 0215           Orpah Greek, MD 01/25/19 [id number]

## 2019-01-25 ENCOUNTER — Emergency Department (HOSPITAL_BASED_OUTPATIENT_CLINIC_OR_DEPARTMENT_OTHER): Payer: Medicare Other

## 2019-01-25 LAB — URINALYSIS, MICROSCOPIC (REFLEX): RBC / HPF: >50 RBC/hpf (ref 0–5)

## 2019-01-25 LAB — COMPREHENSIVE METABOLIC PANEL
ALT: 19 U/L (ref 0–44)
AST: 25 U/L (ref 15–41)
Albumin: 4.6 g/dL (ref 3.5–5.0)
Alkaline Phosphatase: 58 U/L (ref 38–126)
Anion gap: 12 (ref 5–15)
BUN: 14 mg/dL (ref 8–23)
CO2: 24 mmol/L (ref 22–32)
Calcium: 9.4 mg/dL (ref 8.9–10.3)
Chloride: 102 mmol/L (ref 98–111)
Creatinine, Ser: 1.27 mg/dL — ABNORMAL HIGH (ref 0.44–1.00)
GFR calc Af Amer: 46 mL/min — ABNORMAL LOW (ref >60)
GFR calc non Af Amer: 39 mL/min — ABNORMAL LOW (ref >60)
Glucose, Bld: 138 mg/dL — ABNORMAL HIGH (ref 70–99)
Potassium: 3.2 mmol/L — ABNORMAL LOW (ref 3.5–5.1)
Sodium: 138 mmol/L (ref 135–145)
Total Bilirubin: 0.6 mg/dL (ref 0.3–1.2)
Total Protein: 8.2 g/dL — ABNORMAL HIGH (ref 6.5–8.1)

## 2019-01-25 LAB — URINALYSIS, ROUTINE W REFLEX MICROSCOPIC
Bilirubin Urine: NEGATIVE
Glucose, UA: NEGATIVE mg/dL
Ketones, ur: 15 mg/dL — AB
Nitrite: NEGATIVE
Protein, ur: 30 mg/dL — AB
Specific Gravity, Urine: >1.03 — ABNORMAL HIGH (ref 1.005–1.030)
pH: 5.5 (ref 5.0–8.0)

## 2019-01-25 LAB — LIPASE, BLOOD: Lipase: 63 U/L — ABNORMAL HIGH (ref 11–51)

## 2019-01-25 MED ORDER — TAMSULOSIN HCL 0.4 MG PO CAPS: 0.4000 mg | ORAL_CAPSULE | Freq: Every day | ORAL | 0 refills | Status: DC

## 2019-01-25 MED ORDER — ONDANSETRON HCL 4 MG PO TABS: 4.0000 mg | ORAL_TABLET | Freq: Four times a day (QID) | ORAL | 0 refills | Status: DC

## 2019-01-25 MED ORDER — KETOROLAC TROMETHAMINE 30 MG/ML IJ SOLN
15.0000 mg | Freq: Once | INTRAMUSCULAR | Status: AC
  Administered 2019-01-25: 15 mg via INTRAVENOUS
  Filled 2019-01-25: qty 1

## 2019-01-25 MED ORDER — MORPHINE SULFATE (PF) 4 MG/ML IV SOLN
4.0000 mg | Freq: Once | INTRAVENOUS | Status: AC
  Administered 2019-01-25: 4 mg via INTRAVENOUS
  Filled 2019-01-25: qty 1

## 2019-01-25 MED ORDER — OXYCODONE-ACETAMINOPHEN 5-325 MG PO TABS: 1.0000 | ORAL_TABLET | ORAL | 0 refills | Status: DC | PRN

## 2019-01-25 NOTE — ED Notes (Signed)
PT unable to urinate at this time.

## 2019-01-25 NOTE — ED Notes (Signed)
Pt not currently vomiting. Appears more comfortable.

## 2019-01-25 NOTE — ED Notes (Signed)
Patient transported to CT 

## 2019-01-27 ENCOUNTER — Emergency Department (HOSPITAL_COMMUNITY)
Admission: EM | Admit: 2019-01-27 | Discharge: 2019-01-27 | Disposition: A | Discharge status: Home / Self Care | Payer: Medicare Other | Attending: Emergency Medicine | Admitting: Emergency Medicine

## 2019-01-27 ENCOUNTER — Other Ambulatory Visit: Payer: Self-pay

## 2019-01-27 ENCOUNTER — Encounter (HOSPITAL_COMMUNITY): Payer: Self-pay | Admitting: Emergency Medicine

## 2019-01-27 DIAGNOSIS — N23 Unspecified renal colic: Secondary | ICD-10-CM | POA: Diagnosis not present

## 2019-01-27 DIAGNOSIS — Z79899 Other long term (current) drug therapy: Secondary | ICD-10-CM | POA: Diagnosis not present

## 2019-01-27 DIAGNOSIS — R1010 Upper abdominal pain, unspecified: Secondary | ICD-10-CM | POA: Diagnosis present

## 2019-01-27 LAB — CBC WITH DIFFERENTIAL/PLATELET
Abs Immature Granulocytes: 0.04 10*3/uL (ref 0.00–0.07)
Basophils Absolute: 0 10*3/uL (ref 0.0–0.1)
Basophils Relative: 1 %
Eosinophils Absolute: 0.1 10*3/uL (ref 0.0–0.5)
Eosinophils Relative: 1 %
HCT: 37.3 % (ref 36.0–46.0)
Hemoglobin: 12.5 g/dL (ref 12.0–15.0)
Immature Granulocytes: 1 %
Lymphocytes Relative: 25 %
Lymphs Abs: 1.8 10*3/uL (ref 0.7–4.0)
MCH: 30.9 pg (ref 26.0–34.0)
MCHC: 33.5 g/dL (ref 30.0–36.0)
MCV: 92.3 fL (ref 80.0–100.0)
Monocytes Absolute: 0.8 10*3/uL (ref 0.1–1.0)
Monocytes Relative: 12 %
Neutro Abs: 4.4 10*3/uL (ref 1.7–7.7)
Neutrophils Relative %: 60 %
Platelets: 209 10*3/uL (ref 150–400)
RBC: 4.04 MIL/uL (ref 3.87–5.11)
RDW: 12.4 % (ref 11.5–15.5)
WBC: 7.1 10*3/uL (ref 4.0–10.5)
nRBC: 0 % (ref 0.0–0.2)

## 2019-01-27 LAB — BASIC METABOLIC PANEL
Anion gap: 9 (ref 5–15)
BUN: 8 mg/dL (ref 8–23)
CO2: 25 mmol/L (ref 22–32)
Calcium: 9.1 mg/dL (ref 8.9–10.3)
Chloride: 103 mmol/L (ref 98–111)
Creatinine, Ser: 1.01 mg/dL — ABNORMAL HIGH (ref 0.44–1.00)
GFR calc Af Amer: >60 mL/min (ref >60)
GFR calc non Af Amer: 52 mL/min — ABNORMAL LOW (ref >60)
Glucose, Bld: 105 mg/dL — ABNORMAL HIGH (ref 70–99)
Potassium: 3.6 mmol/L (ref 3.5–5.1)
Sodium: 137 mmol/L (ref 135–145)

## 2019-01-27 MED ORDER — OXYCODONE-ACETAMINOPHEN 5-325 MG PO TABS: 1.0000 | ORAL_TABLET | Freq: Four times a day (QID) | ORAL | 0 refills | Status: DC | PRN

## 2019-01-27 MED ORDER — IBUPROFEN 200 MG PO TABS
600.0000 mg | ORAL_TABLET | Freq: Once | ORAL | Status: AC
  Administered 2019-01-27: 600 mg via ORAL
  Filled 2019-01-27: qty 3

## 2019-01-27 MED ORDER — IBUPROFEN 400 MG PO TABS: 400.0000 mg | ORAL_TABLET | Freq: Four times a day (QID) | ORAL | 0 refills | Status: DC | PRN

## 2019-01-27 MED ORDER — OXYCODONE-ACETAMINOPHEN 5-325 MG PO TABS
1.0000 | ORAL_TABLET | Freq: Once | ORAL | Status: AC
  Administered 2019-01-27: 1 via ORAL
  Filled 2019-01-27: qty 1

## 2019-01-27 NOTE — ED Notes (Signed)
Patient made aware of need for urine sample. Patient stated they don't think they will be able to at this time.

## 2019-01-27 NOTE — ED Notes (Signed)
Patient given discharge teaching and verbalized understanding. Patient was taken out of ED w/ wheelchair to family members car.

## 2019-01-27 NOTE — ED Provider Notes (Signed)
[facility] DE[facility] Provider Note   CSN: [id number] Arrival date & time: 01/27/19  0810    History   Chief Complaint Chief Complaint  Patient presents with  . Flank Pain    HPI Joyce Ray is a 84 y.o. female.     HPI Patient presents to the emergency department with continued pain being diagnosed 2 nights ago with a ureteral stone.  Patient states that when the pain medicine wears off she has pain that is fairly intense.  She states the nausea vomiting has ceased.  Patient states she has not had any fevers.  The patient states that the medication does not take the pain away.  The patient denies chest pain, shortness of breath, headache,blurred vision, neck pain, fever, cough, weakness, numbness, dizziness, anorexia, edema, nausea, vomiting, diarrhea, rash, back pain, dysuria, hematemesis, bloody stool, near syncope, or syncope. Past Medical History:  Diagnosis Date  . Fibromyalgia     There are no active problems to display for this patient.   Past Surgical History:  Procedure Laterality Date  . ABDOMINAL HYSTERECTOMY    . BREAST SURGERY    . COLON SURGERY       OB History   No obstetric history on file.      Home Medications    Prior to Admission medications   Medication Sig Start Date End Date Taking? Authorizing Provider  amitriptyline (ELAVIL) 10 MG tablet Take 10 mg by mouth 2 (two) times a day.  01/19/19  Yes [provider]  ondansetron (ZOFRAN) 4 MG tablet Take 1 tablet (4 mg total) by mouth every 6 (six) hours. 01/25/19  Yes Pollina, Gwenyth Allegra, MD  oxyCODONE-acetaminophen (PERCOCET) 5-325 MG tablet Take 1 tablet by mouth every 4 (four) hours as needed. Patient taking differently: Take 1 tablet by mouth every 4 (four) hours as needed for severe pain.  01/25/19  Yes Pollina, Gwenyth Allegra, MD  tamsulosin (FLOMAX) 0.4 MG CAPS capsule Take 1 capsule (0.4 mg total) by mouth daily. 01/25/19  Yes Pollina, Gwenyth Allegra, MD   HYDROcodone-acetaminophen (NORCO) 5-325 MG per tablet Take 1-2 tablets by mouth every 6 (six) hours as needed for moderate pain. Patient not taking: Reported on 07/15/2018 10/07/14   Margarita Mail, PA-C  LORazepam (ATIVAN) 0.5 MG tablet Take 1 tablet (0.5 mg total) by mouth every 8 (eight) hours. Patient not taking: Reported on 07/15/2018 04/19/14   Daleen Bo, MD    Family History History reviewed. No pertinent family history.  Social History Social History   Tobacco Use  . Smoking status: Never Smoker  . Smokeless tobacco: Never Used  Substance Use Topics  . Alcohol use: No  . Drug use: No     Allergies   Darvon [propoxyphene]   Review of Systems Review of Systems All other systems negative except as documented in the HPI. All pertinent positives and negatives as reviewed in the HPI.  Physical Exam Updated Vital Signs BP 111/74 (BP Location: Right Arm)   Pulse 79   Temp 99.4 F (37.4 C) (Oral)   Resp 15   Ht 5\' 8"  (1.727 m)   Wt 77.1 kg   SpO2 94%   BMI 25.85 kg/m   Physical Exam Vitals signs and nursing note reviewed.  Constitutional:      General: She is not in acute distress.    Appearance: She is well-developed.  HENT:     Head: Normocephalic and atraumatic.  Eyes:     Pupils: Pupils are equal,  round, and reactive to light.  Neck:     Musculoskeletal: Normal range of motion and neck supple.  Cardiovascular:     Rate and Rhythm: Normal rate and regular rhythm.     Heart sounds: Normal heart sounds. No murmur. No friction rub. No gallop.   Pulmonary:     Effort: Pulmonary effort is normal. No respiratory distress.     Breath sounds: Normal breath sounds. No wheezing.  Abdominal:     General: Bowel sounds are normal. There is no distension.     Palpations: Abdomen is soft.     Tenderness: There is no abdominal tenderness.  Skin:    General: Skin is warm and dry.     Capillary Refill: Capillary refill takes less than 2 seconds.     Findings: No  erythema or rash.  Neurological:     Mental Status: She is alert and oriented to person, place, and time.     Motor: No abnormal muscle tone.     Coordination: Coordination normal.  Psychiatric:        Behavior: Behavior normal.      ED Treatments / Results  Labs (all labs ordered are listed, but only abnormal results are displayed) Labs Reviewed  BASIC METABOLIC PANEL - Abnormal; Notable for the following components:      Result Value   Glucose, Bld 105 (*)    Creatinine, Ser 1.01 (*)    GFR calc non Af Amer 52 (*)    All other components within normal limits  CBC WITH DIFFERENTIAL/PLATELET  URINALYSIS, ROUTINE W REFLEX MICROSCOPIC    EKG None  Radiology No results found.  Procedures Procedures (including critical care time)  Medications Ordered in ED Medications - No data to display   Initial Impression / Assessment and Plan / ED Course  I have reviewed the triage vital signs and the nursing notes.  Pertinent labs & imaging results that were available during my care of the patient were reviewed by me and considered in my medical decision making (see chart for details).        Patient has a ureteral stone and is still having pain with this.  She states the pain medicine wears off about 3 to 3-1/2 hours after taking it.  And states that the pain comes back.  I have advised her that she will need to contact urology as soon as possible.  The patient has laboratory testing that is reassuring.  She does not have any fever or vital sign abnormality here in the emergency department.  I advised patient she will need to return to the emergency department for worsening symptoms such as fever and worsening pain.  We will add ibuprofen onto the regimen and have her call urology as soon as possible for further evaluation and care.  Final Clinical Impressions(s) / ED Diagnoses   Final diagnoses:  None    ED Discharge Orders    None       Dalia Heading, PA-C  01/27/19 1055    Valarie Merino, MD 01/27/19 [phone]

## 2019-01-27 NOTE — ED Notes (Signed)
Patient ambulated to the bathroom w/ assistance.

## 2019-01-27 NOTE — Discharge Instructions (Addendum)
Return here as needed. Call the Urologist for further care.

## 2019-01-27 NOTE — ED Notes (Signed)
This Probation officer took patient to the bathroom to provide urinalysis. Patient was unable to provide enough urine for urinalysis. Patient stated tell the provider I will pee when I go home. This Probation officer will let the provider know.

## 2019-01-27 NOTE — ED Triage Notes (Signed)
Patient arrived by self from home. Patient c/o LFT sided flank pain.   Pt was seen 3 days ago for same issue. Patient stated they've been drinking a lot of lemon water at home to see if that would help.   Patient stated pain is 08/10 when taking oxycodone at home. Pt has been taking Flomax and Zofran as well.   Pt rates pain 10/10.

## 2019-02-04 ENCOUNTER — Ambulatory Visit (HOSPITAL_COMMUNITY): Payer: Medicare Other | Admitting: Anesthesiology

## 2019-02-04 ENCOUNTER — Other Ambulatory Visit: Payer: Self-pay

## 2019-02-04 ENCOUNTER — Encounter (HOSPITAL_COMMUNITY)
Admission: RE | Disposition: A | Discharge status: Home / Self Care | Payer: Self-pay | Source: Ambulatory Visit | Attending: Urology

## 2019-02-04 ENCOUNTER — Other Ambulatory Visit (HOSPITAL_COMMUNITY)
Admission: RE | Admit: 2019-02-04 | Discharge: 2019-02-04 | Disposition: A | Discharge status: Home / Self Care | Payer: Medicare Other | Source: Ambulatory Visit | Attending: Urology | Admitting: Urology

## 2019-02-04 ENCOUNTER — Other Ambulatory Visit: Payer: Self-pay | Admitting: Urology

## 2019-02-04 ENCOUNTER — Encounter (HOSPITAL_COMMUNITY): Payer: Self-pay

## 2019-02-04 ENCOUNTER — Ambulatory Visit (HOSPITAL_COMMUNITY): Payer: Medicare Other

## 2019-02-04 ENCOUNTER — Ambulatory Visit (HOSPITAL_COMMUNITY)
Admission: RE | Admit: 2019-02-04 | Discharge: 2019-02-04 | Disposition: A | Discharge status: Home / Self Care | Payer: Medicare Other | Source: Ambulatory Visit | Attending: Urology | Admitting: Urology

## 2019-02-04 DIAGNOSIS — Z1159 Encounter for screening for other viral diseases: Secondary | ICD-10-CM | POA: Insufficient documentation

## 2019-02-04 DIAGNOSIS — Z79899 Other long term (current) drug therapy: Secondary | ICD-10-CM | POA: Diagnosis not present

## 2019-02-04 DIAGNOSIS — N132 Hydronephrosis with renal and ureteral calculous obstruction: Secondary | ICD-10-CM | POA: Diagnosis present

## 2019-02-04 DIAGNOSIS — F419 Anxiety disorder, unspecified: Secondary | ICD-10-CM | POA: Diagnosis not present

## 2019-02-04 DIAGNOSIS — M797 Fibromyalgia: Secondary | ICD-10-CM | POA: Diagnosis not present

## 2019-02-04 DIAGNOSIS — R8271 Bacteriuria: Secondary | ICD-10-CM | POA: Diagnosis not present

## 2019-02-04 HISTORY — PX: CYSTOSCOPY WITH STENT PLACEMENT: SHX5790

## 2019-02-04 LAB — SARS CORONAVIRUS 2 BY RT PCR (HOSPITAL ORDER, PERFORMED IN ~~LOC~~ HOSPITAL LAB): SARS Coronavirus 2: NEGATIVE

## 2019-02-04 SURGERY — CYSTOSCOPY, WITH STENT INSERTION
Anesthesia: General | Laterality: Left

## 2019-02-04 MED ORDER — PROPOFOL 10 MG/ML IV BOLUS
INTRAVENOUS | Status: DC | PRN
  Administered 2019-02-04: 80 mg via INTRAVENOUS
  Administered 2019-02-04: 120 mg via INTRAVENOUS

## 2019-02-04 MED ORDER — OXYCODONE HCL 5 MG/5ML PO SOLN: 5.0000 mg | Freq: Once | ORAL | Status: AC | PRN

## 2019-02-04 MED ORDER — HYDROCODONE-ACETAMINOPHEN 5-325 MG PO TABS: 1.0000 | ORAL_TABLET | Freq: Four times a day (QID) | ORAL | 0 refills | Status: DC | PRN

## 2019-02-04 MED ORDER — CEFAZOLIN SODIUM-DEXTROSE 2-4 GM/100ML-% IV SOLN
2.0000 g | INTRAVENOUS | Status: AC
  Administered 2019-02-04: 2 g via INTRAVENOUS
  Filled 2019-02-04: qty 100

## 2019-02-04 MED ORDER — FENTANYL CITRATE (PF) 100 MCG/2ML IJ SOLN
INTRAMUSCULAR | Status: DC | PRN
  Administered 2019-02-04: 100 ug via INTRAVENOUS

## 2019-02-04 MED ORDER — LIDOCAINE 2% (20 MG/ML) 5 ML SYRINGE
INTRAMUSCULAR | Status: DC | PRN
  Administered 2019-02-04: 60 mg via INTRAVENOUS

## 2019-02-04 MED ORDER — FENTANYL CITRATE (PF) 100 MCG/2ML IJ SOLN
INTRAMUSCULAR | Status: AC
  Filled 2019-02-04: qty 2

## 2019-02-04 MED ORDER — PROPOFOL 10 MG/ML IV BOLUS
INTRAVENOUS | Status: AC
  Filled 2019-02-04: qty 20

## 2019-02-04 MED ORDER — OXYCODONE HCL 5 MG PO TABS
5.0000 mg | ORAL_TABLET | Freq: Once | ORAL | Status: AC | PRN
  Administered 2019-02-04: 18:00:00 5 mg via ORAL

## 2019-02-04 MED ORDER — STERILE WATER FOR IRRIGATION IR SOLN
Status: DC | PRN
  Administered 2019-02-04: 3000 mL

## 2019-02-04 MED ORDER — FENTANYL CITRATE (PF) 100 MCG/2ML IJ SOLN: 25.0000 ug | INTRAMUSCULAR | Status: DC | PRN

## 2019-02-04 MED ORDER — CIPROFLOXACIN HCL 500 MG PO TABS: 500.0000 mg | ORAL_TABLET | Freq: Two times a day (BID) | ORAL | 0 refills | Status: AC

## 2019-02-04 MED ORDER — ONDANSETRON HCL 4 MG/2ML IJ SOLN: 4.0000 mg | Freq: Once | INTRAMUSCULAR | Status: DC | PRN

## 2019-02-04 MED ORDER — DEXAMETHASONE SODIUM PHOSPHATE 10 MG/ML IJ SOLN
INTRAMUSCULAR | Status: DC | PRN
  Administered 2019-02-04: 5 mg via INTRAVENOUS

## 2019-02-04 MED ORDER — LACTATED RINGERS IV SOLN
INTRAVENOUS | Status: DC
  Administered 2019-02-04 (×2): via INTRAVENOUS

## 2019-02-04 MED ORDER — IOPAMIDOL (ISOVUE-300) INJECTION 61%
INTRAVENOUS | Status: DC | PRN
  Administered 2019-02-04: 10 mL via INTRAVENOUS

## 2019-02-04 MED ORDER — ONDANSETRON HCL 4 MG/2ML IJ SOLN
INTRAMUSCULAR | Status: DC | PRN
  Administered 2019-02-04: 4 mg via INTRAVENOUS

## 2019-02-04 MED ORDER — OXYCODONE HCL 5 MG PO TABS
ORAL_TABLET | ORAL | Status: AC
  Administered 2019-02-04: 18:00:00 5 mg via ORAL
  Filled 2019-02-04: qty 1

## 2019-02-04 SURGICAL SUPPLY — 14 items
BAG URO CATCHER STRL LF (MISCELLANEOUS) ×3 IMPLANT
CATH INTERMIT  6FR 70CM (CATHETERS) ×5 IMPLANT
CLOTH BEACON ORANGE TIMEOUT ST (SAFETY) ×3 IMPLANT
COVER WAND RF STERILE (DRAPES) IMPLANT
GLOVE BIO SURGEON STRL SZ7.5 (GLOVE) ×3 IMPLANT
GOWN STRL REUS W/TWL LRG LVL3 (GOWN DISPOSABLE) ×6 IMPLANT
GUIDEWIRE STR DUAL SENSOR (WIRE) ×3 IMPLANT
KIT TURNOVER KIT A (KITS) IMPLANT
MANIFOLD NEPTUNE II (INSTRUMENTS) ×3 IMPLANT
PACK CYSTO (CUSTOM PROCEDURE TRAY) ×3 IMPLANT
STENT URET 6FRX24 CONTOUR (STENTS) ×2 IMPLANT
TUBING CONNECTING 10 (TUBING) ×2 IMPLANT
TUBING CONNECTING 10' (TUBING) ×1
TUBING UROLOGY SET (TUBING) IMPLANT

## 2019-02-04 NOTE — Transfer of Care (Signed)
Immediate Anesthesia Transfer of Care Note  Patient: Joyce Ray  Procedure(s) Performed: CYSTOSCOPY WITH STENT PLACEMENT; LEFT RETROGRADE PYELOGRAM (Left )  Patient Location: PACU  Anesthesia Type:General  Level of Consciousness: sedated, patient cooperative and responds to stimulation  Airway & Oxygen Therapy: Patient Spontanous Breathing and Patient connected to face mask oxygen  Post-op Assessment: Report given to RN and Post -op Vital signs reviewed and stable  Post vital signs: Reviewed and stable  Last Vitals:  Vitals Value Taken Time  BP    Temp    Pulse    Resp    SpO2      Last Pain:  Vitals:   02/04/19 1500  TempSrc: Oral  PainSc:          Complications: No apparent anesthesia complications

## 2019-02-04 NOTE — Anesthesia Procedure Notes (Signed)
Procedure Name: LMA Insertion Performed by: Alphus Zeck J, CRNA Pre-anesthesia Checklist: Patient identified, Emergency Drugs available, Suction available, Patient being monitored and Timeout performed Patient Re-evaluated:Patient Re-evaluated prior to induction Oxygen Delivery Method: Circle system utilized Preoxygenation: Pre-oxygenation with 100% oxygen Induction Type: IV induction Ventilation: Mask ventilation without difficulty LMA: LMA inserted LMA Size: 4.0 Placement Confirmation: positive ETCO2 and breath sounds checked- equal and bilateral Tube secured with: Tape Dental Injury: Teeth and Oropharynx as per pre-operative assessment        

## 2019-02-04 NOTE — H&P (View-Only) (Signed)
CC: I have ureteral stone.  HPI: Joyce Ray is a 84 year-old female established patient who is here for ureteral stone.  02/04/19: Joyce Ray returns today for follow up. Most recent culture showed no growth. She did not complete course of Bactrim. Today returns for follow up. She states that she may have seen "something" pass about 4 days ago, but is not sure. She states that pain resolved after this event. She denies any current left flank or abdominal pain. She does note slight discomfort today associated with voiding. She denies exacerbation of voiding symptoms or gross hematuria. She had some intermittent chills and complains of general malaise today. Her temperature is noted to be 99 today and her HR is elevated. She denies past cardiac history. She is not on blood thinners. She has tolerated general anesthesia in the past.   01/28/19: Joyce Ray is an 84 yo WF who had the onset a week ago of left flank pain. She was seen in the ED on 5/5 and a CT shows a 4.57m left proximal stone with obstruction. She presents today with persistent severe pain with nausea and vomiting. she has no frequency, urgency or hematuria. She has no fever. She has no prior history of stones. She has been told she had IC in her 89'sbut she has had not trouble in years. She is on elavil for fibromyalgia.   The problem is on the left side. She first stated noticing pain on approximately 01/21/2019.     ALLERGIES: None   MEDICATIONS: Tamsulosin Hcl 0.4 mg capsule 1 capsule PO Daily  Amitriptyline Hcl  Ibuprofen 200 mg tablet  Ondansetron Hcl  Oxycodone-Acetaminophen 5 mg-325 mg tablet  Promethazine Hcl 25 mg tablet 1 tablet PO Q 6 H PRN prn nausea     GU PSH: Hysterectomy      PSH Notes: Colon surgery   NON-GU PSH: Anesth, Surgery Of Breast    GU PMH: Ureteral calculus, She has a 4.634mleft ureteral stone with pain and nausea and got some relief with phenergan and toradol. I discussed continued MET vs URS/stenting  and she would like to try and pass the stone. I have sent scripts for phenergan and hydromorphone and reviewed the instructions and side effects. I also discussed stenting and URS and reviewed the risks should her symptoms progress over the weekend. She will return in a week for reevaluation. - 01/28/2019    NON-GU PMH: Bacteriuria, She has no symptoms of infection and there are epis on the UA suggesting a contaminated specimen but I am going to check a culture and start her on bactrim pending the culture. She will let me know if she gets a fever. - 01/28/2019 Fibromyalgia    FAMILY HISTORY: Hypercholesterolemia - Runs in Family   SOCIAL HISTORY: Marital Status: Widowed Current Smoking Status: Patient has never smoked.   Tobacco Use Assessment Completed: Used Tobacco in last 30 days? Has never drank.     REVIEW OF SYSTEMS:    GU Review Female:   Patient reports burning /pain with urination. Patient denies frequent urination, hard to postpone urination, get up at night to urinate, leakage of urine, stream starts and stops, trouble starting your stream, have to strain to urinate, and being pregnant.  Gastrointestinal (Upper):   Patient denies nausea, vomiting, and indigestion/ heartburn.  Gastrointestinal (Lower):   Patient denies diarrhea and constipation.  Constitutional:   Patient denies fever, night sweats, weight loss, and fatigue.  Skin:   Patient denies skin rash/  lesion and itching.  Eyes:   Patient denies blurred vision and double vision.  Ears/ Nose/ Throat:   Patient denies sore throat and sinus problems.  Hematologic/Lymphatic:   Patient denies swollen glands and easy bruising.  Cardiovascular:   Patient denies leg swelling and chest pains.  Respiratory:   Patient denies cough and shortness of breath.  Endocrine:   Patient denies excessive thirst.  Musculoskeletal:   Patient denies back pain and joint pain.  Neurological:   Patient denies headaches and dizziness.  Psychologic:    Patient denies depression and anxiety.   VITAL SIGNS:      02/04/2019 10:52 AM  Weight 170 lb / 77.11 kg  Height 68.5 in / 173.99 cm  BP 110/78 mmHg  Pulse 130 /min  Temperature 99.0 F / 37.2 C  BMI 25.5 kg/m  Notes: Vitals reassessed and HR noted to be 102, temperature noted to be 97.7   MULTI-SYSTEM PHYSICAL EXAMINATION:    Constitutional: Well-nourished. No physical deformities. Normally developed. Good grooming. Ill appearing.   Respiratory: No labored breathing, no use of accessory muscles.   Cardiovascular: Normal temperature, normal extremity pulses, no swelling, no varicosities.  Neurologic / Psychiatric: Oriented to time, oriented to place, oriented to person. No depression, no anxiety, no agitation.  Gastrointestinal: No mass, no tenderness, no rigidity, non obese abdomen. No CVAT.   Musculoskeletal: Wheelchair today.      PAST DATA REVIEWED:  Source Of History:  Patient  Records Review:   Previous Patient Records  Urine Test Review:   Urinalysis, Urine Culture  X-Ray Review: C.T. Abdomen/Pelvis: Reviewed Films. Reviewed Report.     PROCEDURES:         Renal Ultrasound (Limited) - 34917  LT Kidney: Length: 11.3 cm Depth: 5.2 cm Cortical Width: 1.3 cm Width: 5.8 cm    Left Kidney/Ureter:  Hydro noted. Prox ureter= 1.1cm  Bladder:  PVR= 45.45ML      Increased bowel gas.          KUB - [id number]  A single view of the abdomen is obtained. No obvious stones noted within the confines of bilateral renal shadows. I do not see any definitive opacities noted along the course of the left ureter. However, there is overlying bowel gas within the pelvis. Prominent, but normal appearing bowel gas noted.       Patient confirmed No Neulasta OnPro Device.           Urinalysis w/Scope Dipstick Dipstick Cont'd Micro  Color: Brown Bilirubin: Neg mg/dL WBC/hpf: 20 - 40/hpf  Appearance: Turbid Ketones: Neg mg/dL RBC/hpf: >60/hpf  Specific Gravity: 1.025 Blood: 3+ ery/uL  Bacteria: Many (>50/hpf)  pH: 5.5 Protein: 2+ mg/dL Cystals: Amorph Urates  Glucose: Neg mg/dL Urobilinogen: 0.2 mg/dL Casts: NS (Not Seen)    Nitrites: Neg Trichomonas: Not Present    Leukocyte Esterase: 3+ leu/uL Mucous: Present      Epithelial Cells: 0 - 5/hpf      Yeast: NS (Not Seen)      Sperm: Not Present    Notes: microscopic not concentrated    ASSESSMENT:      ICD-10 Details  1 GU:   Ureteral calculus - N20.1   2 NON-GU:   Bacteriuria - R82.71    PLAN:           Orders Labs Urine Culture  X-Rays: Renal Ultrasound (Limited) - left     KUB          Schedule  Document Letter(s):  Created for Patient: Clinical Summary         Notes:   I will repeat urine culture today. On KUB imaging, I do not see any definitive opacities noted along the course of the left ureter. However, there is overlying bowel gas within the pelvis which could obscure a stone. There continues to be hydronephrosis noted on RUS. Case reviewed with on call urologist. Given UA findings and her presentation on arrival to clinic today, he will plan to move forward with cystoscopy and left ureteral stent placement this afternoon. We reviewed indications and potential risks of the procedure in detail today. She is in agreement to move forward. She will remain NPO from this point forward. Following up will be pending.         Next Appointment:      Next Appointment: 02/10/2019 03:00 PM    Appointment Type: Postoperative Appointment    Location: [facility] [phone]    Provider: Irine Seal, M.D.    Reason for Visit: POST OP     Signed by Azucena Fallen on 02/04/19 at 2:40 PM (EDT

## 2019-02-04 NOTE — Anesthesia Postprocedure Evaluation (Signed)
Anesthesia Post Note  Patient: Joyce Ray  Procedure(s) Performed: CYSTOSCOPY WITH STENT PLACEMENT; LEFT RETROGRADE PYELOGRAM (Left )     Patient location during evaluation: PACU Anesthesia Type: General Level of consciousness: awake and alert Pain management: pain level controlled Vital Signs Assessment: post-procedure vital signs reviewed and stable Respiratory status: spontaneous breathing, nonlabored ventilation and respiratory function stable Cardiovascular status: blood pressure returned to baseline and stable Postop Assessment: no apparent nausea or vomiting Anesthetic complications: no    Last Vitals:  Vitals:   02/04/19 1730 02/04/19 1800  BP: 131/86 137/88  Pulse: 70 75  Resp: 16 16  Temp: 36.8 C 36.8 C  SpO2: 99% 100%    Last Pain:  Vitals:   02/04/19 1750  TempSrc:   PainSc: 5                  Lidia Collum

## 2019-02-04 NOTE — Op Note (Signed)
Operative Note  Preoperative diagnosis:  1.  Left ureteral calculus  Post operative diagnosis: 1.  Left ureteral calculus  Procedure(s): 1.  Cystoscopy with left retrograde pyelogram and left ureteral stent placement  Surgeon: Link Snuffer, MD  Assistants: None  Anesthesia: General  Complications: None immediate  EBL: Minimal  Specimens: 1.  None  Drains/Catheters: 1.  6 X 24 double-J ureteral stent  Intraoperative findings: 1.  Normal urethra and bladder 2.  Left retrograde pyelogram revealed a filling defect at the level of the stone with upstream hydroureteronephrosis  Indication: 84 year old female with a 5 mm left ureteral calculus who failed trial passage.  She presented with pain, nausea, vomiting, and she was tachycardic with urinalysis consistent with possible infection.  Description of procedure:  The patient was identified and consent was obtained.  The patient was taken to the operating room and placed in the supine position.  The patient was placed under general anesthesia.  Perioperative antibiotics were administered.  The patient was placed in dorsal lithotomy.  Patient was prepped and draped in a standard sterile fashion and a timeout was performed.  A 21 French rigid cystoscope was advanced into the urethra and into the bladder.  The left distal most portion of the ureter was cannulated with an open-ended ureteral catheter.  Retrograde pyelogram was performed with the findings noted above.  A sensor wire was then advanced up to the kidney under fluoroscopic guidance.  A 6 X 24 double-J ureteral stent was advanced up to the kidney under fluoroscopic guidance.  The wire was withdrawn and fluoroscopy confirmed good proximal placement and direct visualization confirmed a good coil within the bladder.  The bladder was drained and the scope withdrawn.  This concluded the operation.  Patient tolerated procedure well and was stable postoperatively.  Plan: Return in 1 to 2  weeks for urine culture and reevaluation and scheduling for ureteroscopy.

## 2019-02-04 NOTE — Discharge Instructions (Addendum)
[facility] °[phone] °Post Ureteroscopy With or Without Stent Instructions ° °Definitions: ° °Ureter: The duct that transports urine from the kidney to the bladder. °Stent:   A plastic hollow tube that is placed into the ureter, from the kidney to the                 bladder to prevent the ureter from swelling shut. ° °GENERAL INSTRUCTIONS: ° °Despite the fact that no skin incisions were used, the area around the ureter and bladder is raw and irritated. The stent is a foreign body which will further irritate the bladder wall. This irritation is manifested by increased frequency of urination, both day and night, and by an increase in the urge to urinate. In some, the urge to urinate is present almost always. Sometimes the urge is strong enough that you may not be able to stop yourself from urinating. The only real cure is to remove the stent and then give time for the bladder wall to heal which can't be done until the danger of the ureter swelling shut has passed, which varies. ° °You may see some blood in your urine while the stent is in place and a few days afterwards. Do not be alarmed, even if the urine was clear for a while. Get off your feet and drink lots of fluids until clearing occurs. If you start to pass clots or don't improve, call us. ° °DIET: °You may return to your normal diet immediately. Because of the raw surface of your bladder, alcohol, spicy foods, acid type foods and drinks with caffeine may cause irritation or frequency and should be used in moderation. To keep your urine flowing freely and to avoid constipation, drink plenty of fluids during the day ( 8-10 glasses ). °Tip: Avoid cranberry juice because it is very acidic. ° °ACTIVITY: °Your physical activity doesn't need to be restricted. However, if you are very active, you may see some blood in your urine. We suggest that you reduce your activity under these circumstances until the bleeding has stopped. ° °BOWELS: °It is  important to keep your bowels regular during the postoperative period. Straining with bowel movements can cause bleeding. A bowel movement every other day is reasonable. Use a mild laxative if needed, such as Milk of Magnesia 2-3 tablespoons, or 2 Dulcolax tablets. Call if you continue to have problems. If you have been taking narcotics for pain, before, during or after your surgery, you may be constipated. Take a laxative if necessary. ° ° °MEDICATION: °You should resume your pre-surgery medications unless told not to. °You may take oxybutynin or flomax if prescribed for bladder spasms or discomfort from the stent °Take pain medication as directed for pain refractory to conservative management ° °PROBLEMS YOU SHOULD REPORT TO US: °· Fevers over 100.5 Fahrenheit. °· Heavy bleeding, or clots ( See above notes about blood in urine ). °· Inability to urinate. °· Drug reactions ( hives, rash, nausea, vomiting, diarrhea ). °· Severe burning or pain with urination that is not improving. ° ° °General Anesthesia, Adult, Care After °This sheet gives you information about how to care for yourself after your procedure. Your health care provider may also give you more specific instructions. If you have problems or questions, contact your health care provider. °What can I expect after the procedure? °After the procedure, the following side effects are common: °· Pain or discomfort at the IV site. °· Nausea. °· Vomiting. °· Sore throat. °· Trouble concentrating. °· Feeling   cold or chills. °· Weak or tired. °· Sleepiness and fatigue. °· Soreness and body aches. These side effects can affect parts of the body that were not involved in surgery. °Follow these instructions at home: ° °For at least 24 hours after the procedure: °· Have a responsible adult stay with you. It is important to have someone help care for you until you are awake and alert. °· Rest as needed. °· Do not: °? Participate in activities in which you could fall or  become injured. °? Drive. °? Use heavy machinery. °? Drink alcohol. °? Take sleeping pills or medicines that cause drowsiness. °? Make important decisions or sign legal documents. °? Take care of children on your own. °Eating and drinking °· Follow any instructions from your health care provider about eating or drinking restrictions. °· When you feel hungry, start by eating small amounts of foods that are soft and easy to digest (bland), such as toast. Gradually return to your regular diet. °· Drink enough fluid to keep your urine pale yellow. °· If you vomit, rehydrate by drinking water, juice, or clear broth. °General instructions °· If you have sleep apnea, surgery and certain medicines can increase your risk for breathing problems. Follow instructions from your health care provider about wearing your sleep device: °? Anytime you are sleeping, including during daytime naps. °? While taking prescription pain medicines, sleeping medicines, or medicines that make you drowsy. °· Return to your normal activities as told by your health care provider. Ask your health care provider what activities are safe for you. °· Take over-the-counter and prescription medicines only as told by your health care provider. °· If you smoke, do not smoke without supervision. °· Keep all follow-up visits as told by your health care provider. This is important. °Contact a health care provider if: °· You have nausea or vomiting that does not get better with medicine. °· You cannot eat or drink without vomiting. °· You have pain that does not get better with medicine. °· You are unable to pass urine. °· You develop a skin rash. °· You have a fever. °· You have redness around your IV site that gets worse. °Get help right away if: °· You have difficulty breathing. °· You have chest pain. °· You have blood in your urine or stool, or you vomit blood. °Summary °· After the procedure, it is common to have a sore throat or nausea. It is also common  to feel tired. °· Have a responsible adult stay with you for the first 24 hours after general anesthesia. It is important to have someone help care for you until you are awake and alert. °· When you feel hungry, start by eating small amounts of foods that are soft and easy to digest (bland), such as toast. Gradually return to your regular diet. °· Drink enough fluid to keep your urine pale yellow. °· Return to your normal activities as told by your health care provider. Ask your health care provider what activities are safe for you. °This information is not intended to replace advice given to you by your health care provider. Make sure you discuss any questions you have with your health care provider. °Document Released: 12/15/2000 Document Revised: 04/24/2017 Document Reviewed: 04/24/2017 °Elsevier Interactive Patient Education © 2019 [employer] ° ° °

## 2019-02-04 NOTE — Anesthesia Preprocedure Evaluation (Addendum)
Anesthesia Evaluation    Reviewed: Allergy & Precautions, Patient's Chart, lab work & pertinent test results  History of Anesthesia Complications Negative for: history of anesthetic complications  Airway Mallampati: II  TM Distance: >3 FB Neck ROM: Full    Dental  (+) Upper Dentures, Lower Dentures   Pulmonary neg pulmonary ROS,    Pulmonary exam normal        Cardiovascular negative cardio ROS Normal cardiovascular exam     Neuro/Psych PSYCHIATRIC DISORDERS Anxiety negative neurological ROS     GI/Hepatic negative GI ROS, Neg liver ROS,   Endo/Other  negative endocrine ROS  Renal/GU negative Renal ROS  negative genitourinary   Musculoskeletal  (+) Fibromyalgia -  Abdominal   Peds  Hematology negative hematology ROS (+)   Anesthesia Other Findings   Reproductive/Obstetrics                            Anesthesia Physical Anesthesia Plan  ASA: II  Anesthesia Plan: General   Post-op Pain Management:    Induction: Intravenous  PONV Risk Score and Plan: 3 and Ondansetron, Dexamethasone and Treatment may vary due to age or medical condition  Airway Management Planned: LMA  Additional Equipment: None  Intra-op Plan:   Post-operative Plan: Extubation in OR  Informed Consent: I have reviewed the patients History and Physical, chart, labs and discussed the procedure including the risks, benefits and alternatives for the proposed anesthesia with the patient or authorized representative who has indicated his/her understanding and acceptance.     Dental advisory given  Plan Discussed with:   Anesthesia Plan Comments:        Anesthesia Quick Evaluation

## 2019-02-04 NOTE — H&P (Signed)
CC: I have ureteral stone.  HPI: Joyce Ray is a 84 year-old female established patient who is here for ureteral stone.  02/04/19: Joyce Ray returns today for follow up. Most recent culture showed no growth. She did not complete course of Bactrim. Today returns for follow up. She states that she may have seen "something" pass about 4 days ago, but is not sure. She states that pain resolved after this event. She denies any current left flank or abdominal pain. She does note slight discomfort today associated with voiding. She denies exacerbation of voiding symptoms or gross hematuria. She had some intermittent chills and complains of general malaise today. Her temperature is noted to be 99 today and her HR is elevated. She denies past cardiac history. She is not on blood thinners. She has tolerated general anesthesia in the past.   01/28/19: Joyce Ray is an 84 yo WF who had the onset a week ago of left flank pain. She was seen in the ED on 5/5 and a CT shows a 4.6mm left proximal stone with obstruction. She presents today with persistent severe pain with nausea and vomiting. she has no frequency, urgency or hematuria. She has no fever. She has no prior history of stones. She has been told she had IC in her 30's but she has had not trouble in years. She is on elavil for fibromyalgia.   The problem is on the left side. She first stated noticing pain on approximately 01/21/2019.     ALLERGIES: None   MEDICATIONS: Tamsulosin Hcl 0.4 mg capsule 1 capsule PO Daily  Amitriptyline Hcl  Ibuprofen 200 mg tablet  Ondansetron Hcl  Oxycodone-Acetaminophen 5 mg-325 mg tablet  Promethazine Hcl 25 mg tablet 1 tablet PO Q 6 H PRN prn nausea     GU PSH: Hysterectomy      PSH Notes: Colon surgery   NON-GU PSH: Anesth, Surgery Of Breast    GU PMH: Ureteral calculus, She has a 4.6mm left ureteral stone with pain and nausea and got some relief with phenergan and toradol. I discussed continued MET vs URS/stenting  and she would like to try and pass the stone. I have sent scripts for phenergan and hydromorphone and reviewed the instructions and side effects. I also discussed stenting and URS and reviewed the risks should her symptoms progress over the weekend. She will return in a week for reevaluation. - 01/28/2019    NON-GU PMH: Bacteriuria, She has no symptoms of infection and there are epis on the UA suggesting a contaminated specimen but I am going to check a culture and start her on bactrim pending the culture. She will let me know if she gets a fever. - 01/28/2019 Fibromyalgia    FAMILY HISTORY: Hypercholesterolemia - Runs in Family   SOCIAL HISTORY: Marital Status: Widowed Current Smoking Status: Patient has never smoked.   Tobacco Use Assessment Completed: Used Tobacco in last 30 days? Has never drank.     REVIEW OF SYSTEMS:    GU Review Female:   Patient reports burning /pain with urination. Patient denies frequent urination, hard to postpone urination, get up at night to urinate, leakage of urine, stream starts and stops, trouble starting your stream, have to strain to urinate, and being pregnant.  Gastrointestinal (Upper):   Patient denies nausea, vomiting, and indigestion/ heartburn.  Gastrointestinal (Lower):   Patient denies diarrhea and constipation.  Constitutional:   Patient denies fever, night sweats, weight loss, and fatigue.  Skin:   Patient denies skin rash/   lesion and itching.  Eyes:   Patient denies blurred vision and double vision.  Ears/ Nose/ Throat:   Patient denies sore throat and sinus problems.  Hematologic/Lymphatic:   Patient denies swollen glands and easy bruising.  Cardiovascular:   Patient denies leg swelling and chest pains.  Respiratory:   Patient denies cough and shortness of breath.  Endocrine:   Patient denies excessive thirst.  Musculoskeletal:   Patient denies back pain and joint pain.  Neurological:   Patient denies headaches and dizziness.  Psychologic:    Patient denies depression and anxiety.   VITAL SIGNS:      02/04/2019 10:52 AM  Weight 170 lb / 77.11 kg  Height 68.5 in / 173.99 cm  BP 110/78 mmHg  Pulse 130 /min  Temperature 99.0 F / 37.2 C  BMI 25.5 kg/m  Notes: Vitals reassessed and HR noted to be 102, temperature noted to be 97.7   MULTI-SYSTEM PHYSICAL EXAMINATION:    Constitutional: Well-nourished. No physical deformities. Normally developed. Good grooming. Ill appearing.   Respiratory: No labored breathing, no use of accessory muscles.   Cardiovascular: Normal temperature, normal extremity pulses, no swelling, no varicosities.  Neurologic / Psychiatric: Oriented to time, oriented to place, oriented to person. No depression, no anxiety, no agitation.  Gastrointestinal: No mass, no tenderness, no rigidity, non obese abdomen. No CVAT.   Musculoskeletal: Wheelchair today.      PAST DATA REVIEWED:  Source Of History:  Patient  Records Review:   Previous Patient Records  Urine Test Review:   Urinalysis, Urine Culture  X-Ray Review: C.T. Abdomen/Pelvis: Reviewed Films. Reviewed Report.     PROCEDURES:         Renal Ultrasound (Limited) - 76775  LT Kidney: Length: 11.3 cm Depth: 5.2 cm Cortical Width: 1.3 cm Width: 5.8 cm    Left Kidney/Ureter:  Hydro noted. Prox ureter= 1.1cm  Bladder:  PVR= 45.45ML      Increased bowel gas.          KUB - 74018  A single view of the abdomen is obtained. No obvious stones noted within the confines of bilateral renal shadows. I do not see any definitive opacities noted along the course of the left ureter. However, there is overlying bowel gas within the pelvis. Prominent, but normal appearing bowel gas noted.       Patient confirmed No Neulasta OnPro Device.           Urinalysis w/Scope Dipstick Dipstick Cont'd Micro  Color: Brown Bilirubin: Neg mg/dL WBC/hpf: 20 - 40/hpf  Appearance: Turbid Ketones: Neg mg/dL RBC/hpf: >60/hpf  Specific Gravity: 1.025 Blood: 3+ ery/uL  Bacteria: Many (>50/hpf)  pH: 5.5 Protein: 2+ mg/dL Cystals: Amorph Urates  Glucose: Neg mg/dL Urobilinogen: 0.2 mg/dL Casts: NS (Not Seen)    Nitrites: Neg Trichomonas: Not Present    Leukocyte Esterase: 3+ leu/uL Mucous: Present      Epithelial Cells: 0 - 5/hpf      Yeast: NS (Not Seen)      Sperm: Not Present    Notes: microscopic not concentrated    ASSESSMENT:      ICD-10 Details  1 GU:   Ureteral calculus - N20.1   2 NON-GU:   Bacteriuria - R82.71    PLAN:           Orders Labs Urine Culture  X-Rays: Renal Ultrasound (Limited) - left     KUB          Schedule           Document Letter(s):  Created for Patient: Clinical Summary         Notes:   I will repeat urine culture today. On KUB imaging, I do not see any definitive opacities noted along the course of the left ureter. However, there is overlying bowel gas within the pelvis which could obscure a stone. There continues to be hydronephrosis noted on RUS. Case reviewed with on call urologist. Given UA findings and her presentation on arrival to clinic today, he will plan to move forward with cystoscopy and left ureteral stent placement this afternoon. We reviewed indications and potential risks of the procedure in detail today. She is in agreement to move forward. She will remain NPO from this point forward. Following up will be pending.         Next Appointment:      Next Appointment: 02/10/2019 03:00 PM    Appointment Type: Postoperative Appointment    Location: [facility] - [id number]    Provider: John Wrenn, M.D.    Reason for Visit: POST OP     Signed by Bree Fleck on 02/04/19 at 2:40 PM (EDT 

## 2019-02-05 ENCOUNTER — Encounter (HOSPITAL_COMMUNITY): Payer: Self-pay | Admitting: Urology

## 2019-02-11 ENCOUNTER — Other Ambulatory Visit (HOSPITAL_COMMUNITY)
Admission: RE | Admit: 2019-02-11 | Discharge: 2019-02-11 | Disposition: A | Discharge status: Home / Self Care | Payer: Medicare Other | Source: Ambulatory Visit | Attending: Urology | Admitting: Urology

## 2019-02-11 ENCOUNTER — Encounter (HOSPITAL_COMMUNITY): Payer: Self-pay | Admitting: *Deleted

## 2019-02-11 ENCOUNTER — Other Ambulatory Visit: Payer: Self-pay | Admitting: Urology

## 2019-02-11 ENCOUNTER — Other Ambulatory Visit: Payer: Self-pay

## 2019-02-11 DIAGNOSIS — Z1159 Encounter for screening for other viral diseases: Secondary | ICD-10-CM | POA: Insufficient documentation

## 2019-02-11 NOTE — Progress Notes (Signed)
SPOKE W/  _patient via phone     SCREENING SYMPTOMS OF COVID 19:   COUGH--No  RUNNY NOSE--- No  SORE THROAT---No  NASAL CONGESTION----No  SNEEZING----No  SHORTNESS OF BREATH---No  DIFFICULTY BREATHING---No  TEMP >100.0 -----No  UNEXPLAINED BODY ACHES------No  CHILLS --------No   HEADACHES ---------No  LOSS OF SMELL/ TASTE --------No    HAVE YOU OR ANY FAMILY MEMBER TRAVELLED PAST 14 DAYS OUT OF THE   COUNTY---No STATE----No COUNTRY----No  HAVE YOU OR ANY FAMILY MEMBER BEEN EXPOSED TO ANYONE WITH COVID 19? No  Reviewed with patient visitor restrictions at present due to Covid 19 and patient verbalized understanding.

## 2019-02-12 LAB — NOVEL CORONAVIRUS, NAA (HOSP ORDER, SEND-OUT TO REF LAB; TAT 18-24 HRS): SARS-CoV-2, NAA: NOT DETECTED

## 2019-02-14 NOTE — Progress Notes (Signed)
SPOKE W/  _No answer, unable to complete.     SCREENING SYMPTOMS OF COVID 19:   COUGH--  RUNNY NOSE---   SORE THROAT---  NASAL CONGESTION----  SNEEZING----  SHORTNESS OF BREATH---  DIFFICULTY BREATHING---  TEMP >100.0 -----  UNEXPLAINED BODY ACHES------  CHILLS --------   HEADACHES ---------  LOSS OF SMELL/ TASTE --------    HAVE YOU OR ANY FAMILY MEMBER TRAVELLED PAST 14 DAYS OUT OF THE   COUNTY--- STATE---- COUNTRY----  HAVE YOU OR ANY FAMILY MEMBER BEEN EXPOSED TO ANYONE WITH COVID 19?

## 2019-02-15 ENCOUNTER — Encounter (HOSPITAL_COMMUNITY)
Admission: RE | Disposition: A | Discharge status: Home / Self Care | Payer: Self-pay | Source: Home / Self Care | Attending: Urology

## 2019-02-15 ENCOUNTER — Ambulatory Visit (HOSPITAL_COMMUNITY): Payer: Medicare Other | Admitting: Anesthesiology

## 2019-02-15 ENCOUNTER — Ambulatory Visit (HOSPITAL_COMMUNITY): Payer: Medicare Other

## 2019-02-15 ENCOUNTER — Encounter (HOSPITAL_COMMUNITY): Payer: Self-pay | Admitting: General Practice

## 2019-02-15 ENCOUNTER — Ambulatory Visit (HOSPITAL_COMMUNITY)
Admission: RE | Admit: 2019-02-15 | Discharge: 2019-02-15 | Disposition: A | Discharge status: Home / Self Care | Payer: Medicare Other | Attending: Urology | Admitting: Urology

## 2019-02-15 DIAGNOSIS — N136 Pyonephrosis: Secondary | ICD-10-CM | POA: Diagnosis not present

## 2019-02-15 DIAGNOSIS — Z90710 Acquired absence of both cervix and uterus: Secondary | ICD-10-CM | POA: Diagnosis not present

## 2019-02-15 DIAGNOSIS — Z791 Long term (current) use of non-steroidal anti-inflammatories (NSAID): Secondary | ICD-10-CM | POA: Insufficient documentation

## 2019-02-15 DIAGNOSIS — N202 Calculus of kidney with calculus of ureter: Secondary | ICD-10-CM | POA: Diagnosis present

## 2019-02-15 DIAGNOSIS — M797 Fibromyalgia: Secondary | ICD-10-CM | POA: Insufficient documentation

## 2019-02-15 DIAGNOSIS — F419 Anxiety disorder, unspecified: Secondary | ICD-10-CM | POA: Diagnosis not present

## 2019-02-15 DIAGNOSIS — Z79899 Other long term (current) drug therapy: Secondary | ICD-10-CM | POA: Insufficient documentation

## 2019-02-15 DIAGNOSIS — Z8249 Family history of ischemic heart disease and other diseases of the circulatory system: Secondary | ICD-10-CM | POA: Insufficient documentation

## 2019-02-15 HISTORY — PX: CYSTOSCOPY/URETEROSCOPY/HOLMIUM LASER/STENT PLACEMENT: SHX6546

## 2019-02-15 HISTORY — DX: Personal history of urinary calculi: Z87.442

## 2019-02-15 LAB — CBC
HCT: 40.9 % (ref 36.0–46.0)
Hemoglobin: 13.7 g/dL (ref 12.0–15.0)
MCH: 30.6 pg (ref 26.0–34.0)
MCHC: 33.5 g/dL (ref 30.0–36.0)
MCV: 91.3 fL (ref 80.0–100.0)
Platelets: 332 10*3/uL (ref 150–400)
RBC: 4.48 MIL/uL (ref 3.87–5.11)
RDW: 12.7 % (ref 11.5–15.5)
WBC: 7.1 10*3/uL (ref 4.0–10.5)
nRBC: 0 % (ref 0.0–0.2)

## 2019-02-15 LAB — BASIC METABOLIC PANEL
Anion gap: 10 (ref 5–15)
BUN: 8 mg/dL (ref 8–23)
CO2: 22 mmol/L (ref 22–32)
Calcium: 9.3 mg/dL (ref 8.9–10.3)
Chloride: 109 mmol/L (ref 98–111)
Creatinine, Ser: 0.81 mg/dL (ref 0.44–1.00)
GFR calc Af Amer: >60 mL/min (ref >60)
GFR calc non Af Amer: >60 mL/min (ref >60)
Glucose, Bld: 104 mg/dL — ABNORMAL HIGH (ref 70–99)
Potassium: 3.5 mmol/L (ref 3.5–5.1)
Sodium: 141 mmol/L (ref 135–145)

## 2019-02-15 SURGERY — CYSTOSCOPY/URETEROSCOPY/HOLMIUM LASER/STENT PLACEMENT
Anesthesia: General | Site: Ureter | Laterality: Left

## 2019-02-15 MED ORDER — HYDROCODONE-ACETAMINOPHEN 5-325 MG PO TABS: 1.0000 | ORAL_TABLET | Freq: Four times a day (QID) | ORAL | 0 refills | Status: AC | PRN

## 2019-02-15 MED ORDER — FENTANYL CITRATE (PF) 100 MCG/2ML IJ SOLN
INTRAMUSCULAR | Status: DC | PRN
  Administered 2019-02-15: 25 ug via INTRAVENOUS
  Administered 2019-02-15: 50 ug via INTRAVENOUS
  Administered 2019-02-15: 25 ug via INTRAVENOUS

## 2019-02-15 MED ORDER — OXYCODONE HCL 5 MG PO TABS: 5.0000 mg | ORAL_TABLET | ORAL | Status: DC | PRN

## 2019-02-15 MED ORDER — FENTANYL CITRATE (PF) 100 MCG/2ML IJ SOLN
INTRAMUSCULAR | Status: AC
  Filled 2019-02-15: qty 2

## 2019-02-15 MED ORDER — CEFAZOLIN SODIUM-DEXTROSE 2-4 GM/100ML-% IV SOLN
2.0000 g | INTRAVENOUS | Status: AC
  Administered 2019-02-15: 2 g via INTRAVENOUS
  Filled 2019-02-15: qty 100

## 2019-02-15 MED ORDER — ONDANSETRON HCL 4 MG/2ML IJ SOLN
INTRAMUSCULAR | Status: DC | PRN
  Administered 2019-02-15: 4 mg via INTRAVENOUS

## 2019-02-15 MED ORDER — ONDANSETRON HCL 4 MG/2ML IJ SOLN: 4.0000 mg | Freq: Once | INTRAMUSCULAR | Status: DC | PRN

## 2019-02-15 MED ORDER — MEPERIDINE HCL 50 MG/ML IJ SOLN: 6.2500 mg | INTRAMUSCULAR | Status: DC | PRN

## 2019-02-15 MED ORDER — OXYCODONE HCL 5 MG/5ML PO SOLN: 5.0000 mg | Freq: Once | ORAL | Status: AC | PRN

## 2019-02-15 MED ORDER — ACETAMINOPHEN 325 MG PO TABS: 325.0000 mg | ORAL_TABLET | ORAL | Status: DC | PRN

## 2019-02-15 MED ORDER — ACETAMINOPHEN 650 MG RE SUPP
650.0000 mg | RECTAL | Status: DC | PRN
  Filled 2019-02-15: qty 1

## 2019-02-15 MED ORDER — SODIUM CHLORIDE 0.9 % IV SOLN: 250.0000 mL | INTRAVENOUS | Status: DC | PRN

## 2019-02-15 MED ORDER — MIDAZOLAM HCL 2 MG/2ML IJ SOLN
INTRAMUSCULAR | Status: AC
  Administered 2019-02-15: 1 mg
  Filled 2019-02-15: qty 2

## 2019-02-15 MED ORDER — OXYCODONE HCL 5 MG PO TABS
5.0000 mg | ORAL_TABLET | Freq: Once | ORAL | Status: AC | PRN
  Administered 2019-02-15: 5 mg via ORAL

## 2019-02-15 MED ORDER — ACETAMINOPHEN 325 MG PO TABS: 650.0000 mg | ORAL_TABLET | ORAL | Status: DC | PRN

## 2019-02-15 MED ORDER — SODIUM CHLORIDE 0.9 % IR SOLN
Status: DC | PRN
  Administered 2019-02-15: 3000 mL

## 2019-02-15 MED ORDER — CIPROFLOXACIN HCL 250 MG PO TABS: 250.0000 mg | ORAL_TABLET | Freq: Two times a day (BID) | ORAL | 0 refills | Status: AC

## 2019-02-15 MED ORDER — LIDOCAINE HCL (CARDIAC) PF 100 MG/5ML IV SOSY
PREFILLED_SYRINGE | INTRAVENOUS | Status: DC | PRN
  Administered 2019-02-15: 60 mg via INTRAVENOUS

## 2019-02-15 MED ORDER — DEXAMETHASONE SODIUM PHOSPHATE 4 MG/ML IJ SOLN
INTRAMUSCULAR | Status: DC | PRN
  Administered 2019-02-15: 5 mg via INTRAVENOUS

## 2019-02-15 MED ORDER — FENTANYL CITRATE (PF) 100 MCG/2ML IJ SOLN
25.0000 ug | INTRAMUSCULAR | Status: DC | PRN
  Administered 2019-02-15 (×2): 50 ug via INTRAVENOUS

## 2019-02-15 MED ORDER — PROPOFOL 10 MG/ML IV BOLUS
INTRAVENOUS | Status: DC | PRN
  Administered 2019-02-15: 20 mg via INTRAVENOUS
  Administered 2019-02-15: 130 mg via INTRAVENOUS

## 2019-02-15 MED ORDER — SODIUM CHLORIDE 0.9% FLUSH: 3.0000 mL | INTRAVENOUS | Status: DC | PRN

## 2019-02-15 MED ORDER — SODIUM CHLORIDE 0.9% FLUSH: 3.0000 mL | Freq: Two times a day (BID) | INTRAVENOUS | Status: DC

## 2019-02-15 MED ORDER — LACTATED RINGERS IV SOLN
INTRAVENOUS | Status: DC
  Administered 2019-02-15: 12:00:00 via INTRAVENOUS

## 2019-02-15 MED ORDER — MORPHINE SULFATE (PF) 4 MG/ML IV SOLN: 2.0000 mg | INTRAVENOUS | Status: DC | PRN

## 2019-02-15 MED ORDER — ACETAMINOPHEN 160 MG/5ML PO SOLN: 325.0000 mg | ORAL | Status: DC | PRN

## 2019-02-15 MED ORDER — OXYCODONE HCL 5 MG PO TABS
ORAL_TABLET | ORAL | Status: AC
  Filled 2019-02-15: qty 1

## 2019-02-15 MED ORDER — MIDAZOLAM HCL 2 MG/2ML IJ SOLN: 1.0000 mg | Freq: Once | INTRAMUSCULAR | Status: DC

## 2019-02-15 SURGICAL SUPPLY — 25 items
BAG URO CATCHER STRL LF (MISCELLANEOUS) ×3 IMPLANT
BASKET STONE NCOMPASS (UROLOGICAL SUPPLIES) IMPLANT
CATH URET 5FR 28IN OPEN ENDED (CATHETERS) IMPLANT
CATH URET DUAL LUMEN 6-10FR 50 (CATHETERS) ×3 IMPLANT
CLOTH BEACON ORANGE TIMEOUT ST (SAFETY) ×3 IMPLANT
COVER WAND RF STERILE (DRAPES) IMPLANT
EXTRACTOR STONE NITINOL NGAGE (UROLOGICAL SUPPLIES) ×3 IMPLANT
FIBER LASER FLEXIVA 1000 (UROLOGICAL SUPPLIES) IMPLANT
FIBER LASER FLEXIVA 365 (UROLOGICAL SUPPLIES) IMPLANT
FIBER LASER FLEXIVA 550 (UROLOGICAL SUPPLIES) IMPLANT
FIBER LASER TRAC TIP (UROLOGICAL SUPPLIES) IMPLANT
GLOVE SURG SS PI 8.0 STRL IVOR (GLOVE) IMPLANT
GOWN STRL REUS W/TWL XL LVL3 (GOWN DISPOSABLE) ×3 IMPLANT
GUIDEWIRE STR DUAL SENSOR (WIRE) ×3 IMPLANT
IV NS 1000ML (IV SOLUTION) ×2
IV NS 1000ML BAXH (IV SOLUTION) ×1 IMPLANT
IV NS IRRIG 3000ML ARTHROMATIC (IV SOLUTION) ×3 IMPLANT
KIT TURNOVER KIT A (KITS) IMPLANT
MANIFOLD NEPTUNE II (INSTRUMENTS) ×3 IMPLANT
PACK CYSTO (CUSTOM PROCEDURE TRAY) ×3 IMPLANT
SHEATH URETERAL 12FRX35CM (MISCELLANEOUS) ×5 IMPLANT
STENT URET 6FRX24 CONTOUR (STENTS) ×2 IMPLANT
TUBING CONNECTING 10 (TUBING) ×2 IMPLANT
TUBING CONNECTING 10' (TUBING) ×1
TUBING UROLOGY SET (TUBING) ×3 IMPLANT

## 2019-02-15 NOTE — Anesthesia Postprocedure Evaluation (Signed)
Anesthesia Post Note  Patient: RAINEE SWEATT  Procedure(s) Performed: CYSTOSCOPY STENT REMOVAL LEFT, URETEROSCOPY/LEFT  STENT PLACEMENT (Left Ureter)     Patient location during evaluation: PACU Anesthesia Type: General Level of consciousness: awake and alert Pain management: pain level controlled Vital Signs Assessment: post-procedure vital signs reviewed and stable Respiratory status: spontaneous breathing, nonlabored ventilation, respiratory function stable and patient connected to nasal cannula oxygen Cardiovascular status: blood pressure returned to baseline and stable Postop Assessment: no apparent nausea or vomiting Anesthetic complications: no    Last Vitals:  Vitals:   02/15/19 1343 02/15/19 1345  BP: 139/82 138/80  Pulse: 78 84  Resp: (!) 8 13  Temp:  36.9 C  SpO2: 100% 100%    Last Pain:  Vitals:   02/15/19 1116  TempSrc:   PainSc: 3                  Linc Renne

## 2019-02-15 NOTE — Op Note (Signed)
.    Procedure: 1.  Cystoscopy with removal of left double-J stent. 2.  Left ureteroscopy with stone extraction. 3.  Insertion of left double-J stent.  Preop diagnosis: Left proximal ureteral stone.  Postop diagnosis: Same with stone pushed back to the kidney.  Surgeon: Dr. Irine Seal.  Anesthesia: General.  Specimen: Stone.  Drain: 6 [place] by 24 cm left contour double-J stent with tether.  EBL: Minimal.  Complications: None.  Indications: Joyce Ray is an 84 year old female who originally presented with a 4-5 mm left proximal stone with infection.  She underwent stenting on 5/15 and returns now for definitive stone management.  Her stone did not been clearly seen on KUB at her preoperative office visit.  Procedure: She was taken operating room where she was given 2 g of Ancef.  A general anesthetic was induced.  She was placed in lithotomy position and fitted with PAS hose.  She was prepped with Hibiclens and draped in usual sterile fashion.  Cystoscopy was performed using a 23 [place] scope and 30 degree lens.  Examination revealed a stent loop at the left ureteral orifice with mild encrustation.  There was some erythema of the bladder wall from the stent but no other lesions were identified in the right ureteral orifice was unremarkable.  The stent was grasped with grasping forceps and pulled the urethral meatus.  A sensor guidewire was passed to the kidney and the stent was removed.  The 6.5 French semirigid ureteroscope was passed alongside the wire to the proximal ureter and the stone was not seen.  There was some narrowing of the ureter and mild inflammatory changes throughout.  The inner core of a 35 cm 12/14 [place] digital access sheath was advanced over the wire in the ureter was dilated to the level of the UPJ.  The assembled sheath was then passed over the wire and the inner core and wire were removed.  A single lumen digital flexible ureteroscope was passed through the sheath  and advanced into the collecting system.  A stone was not seen at the UPJ but on fluoroscopy there was a suggestion of the stone in the lower pole and visual inspection of this area did confirm a stone which had not been present on the prior CT scan suggesting that this was the ureteral stone it was pushed back into the kidney.  The remaining upper and midpole calyces had no evidence of stones.  The stone was grasped with an engage basket and removed.  The stone crumbled easily but one reasonable intact fragment was obtained.  Further inspection demonstrated an additional small fragment which was removed.  There was some residual grit it was too small to be retrieved and was felt to likely pass on its own.  The ureteroscope was then removed.  The sensor wire was replaced to the kidney and the access sheath was removed.  A 6 French by 24 cm contour double-J stent with tether was passed over the wire to the kidney and the wire was removed.  Good coil was noted in the kidney and a good coil was noted in the bladder.  The cystoscope was reinserted and the bladder was drained.  The stent string was tied close to the meatus, trimmed and tucked vaginally.  She was taken down from lithotomy position, her anesthetic was reversed and she was moved recovery in stable condition.  Her stone was placed with her chart and taken to the PACU.  There were no complications.

## 2019-02-15 NOTE — Discharge Instructions (Addendum)
Ureteral Stent Implantation, Care After Refer to this sheet in the next few weeks. These instructions provide you with information about caring for yourself after your procedure. Your health care provider may also give you more specific instructions. Your treatment has been planned according to current medical practices, but problems sometimes occur. Call your health care provider if you have any problems or questions after your procedure. What can I expect after the procedure? After the procedure, it is common to have:  Nausea.  Mild pain when you urinate. You may feel this pain in your lower back or lower abdomen. Pain should stop within a few minutes after you urinate. This may last for up to 1 week.  A small amount of blood in your urine for several days. Follow these instructions at home:  Medicines  Take over-the-counter and prescription medicines only as told by your health care provider.  If you were prescribed an antibiotic medicine, take it as told by your health care provider. Do not stop taking the antibiotic even if you start to feel better.  Do not drive for 24 hours if you received a sedative.  Do not drive or operate heavy machinery while taking prescription pain medicines. Activity  Return to your normal activities as told by your health care provider. Ask your health care provider what activities are safe for you.  Do not lift anything that is heavier than 10 lb (4.5 kg). Follow this limit for 1 week after your procedure, or for as long as told by your health care provider. General instructions  Watch for any blood in your urine. Call your health care provider if the amount of blood in your urine increases.  If you have a catheter: ? Follow instructions from your health care provider about taking care of your catheter and collection bag. ? Do not take baths, swim, or use a hot tub until your health care provider approves.  Drink enough fluid to keep your urine  clear or pale yellow.  Keep all follow-up visits as told by your health care provider. This is important. Contact a health care provider if:  You have pain that gets worse or does not get better with medicine, especially pain when you urinate.  You have difficulty urinating.  You feel nauseous or you vomit repeatedly during a period of more than 2 days after the procedure. Get help right away if:  Your urine is dark red or has blood clots in it.  You are leaking urine (have incontinence).  The end of the stent comes out of your urethra.  You cannot urinate.  You have sudden, sharp, or severe pain in your abdomen or lower back.  You have a fever.  You may remove the stent by pulling the attached string on Friday morning.  If you don't feel comfortable doing that, please call the office and arrange to come in on Friday to have it done.    This information is not intended to replace advice given to you by your health care provider. Make sure you discuss any questions you have with your health care provider. Document Released: 05/11/2013 Document Revised: 02/14/2016 Document Reviewed: 03/23/2015 Elsevier Interactive Patient Education  2019 [employer].

## 2019-02-15 NOTE — Interval H&P Note (Signed)
History and Physical Interval Note:  She was stented on 5/15 and returns today for stone removal.  02/15/2019 12:45 PM  Joyce Ray  has presented today for surgery, with the diagnosis of March ARB.  The various methods of treatment have been discussed with the patient and family. After consideration of risks, benefits and other options for treatment, the patient has consented to  Procedure(s): CYSTOSCOPYSTENT REMOVAL LEFT URETEROSCOPY/HOLMIUM [place] (Left) as a surgical intervention.  The patient's history has been reviewed, patient examined, no change in status, stable for surgery.  I have reviewed the patient's chart and labs.  Questions were answered to the patient's satisfaction.     Irine Seal

## 2019-02-15 NOTE — Progress Notes (Signed)
SPOKE W/  _patient     SCREENING SYMPTOMS OF COVID 19:   COUGH--no  RUNNY NOSE--- no  SORE THROAT---no  NASAL CONGESTION----no  SNEEZING----no  SHORTNESS OF BREATH---no  DIFFICULTY BREATHING---no  TEMP >100.0 -----no  UNEXPLAINED BODY ACHES------no  CHILLS --------no   HEADACHES ---------no  LOSS OF SMELL/ TASTE --------no    HAVE YOU OR ANY FAMILY MEMBER TRAVELLED PAST 14 DAYS OUT OF THE   COUNTY---no STATE----no COUNTRY----no  HAVE YOU OR ANY FAMILY MEMBER BEEN EXPOSED TO ANYONE WITH COVID 19? no    

## 2019-02-15 NOTE — Transfer of Care (Signed)
Immediate Anesthesia Transfer of Care Note  Patient: Joyce Ray  Procedure(s) Performed: Procedure(s): CYSTOSCOPY STENT REMOVAL LEFT, URETEROSCOPY/LEFT  STENT PLACEMENT (Left)  Patient Location: PACU  Anesthesia Type:General  Level of Consciousness:  sedated, patient cooperative and responds to stimulation  Airway & Oxygen Therapy:Patient Spontanous Breathing and Patient connected to face mask oxgen  Post-op Assessment:  Report given to PACU RN and Post -op Vital signs reviewed and stable  Post vital signs:  Reviewed and stable  Last Vitals:  Vitals:   02/15/19 1236 02/15/19 1237  BP:    Pulse: 77 79  Resp:    Temp:    SpO2: 78% 41%    Complications: No apparent anesthesia complications

## 2019-02-15 NOTE — Anesthesia Procedure Notes (Signed)
Procedure Name: LMA Insertion Date/Time: 02/15/2019 1:08 PM Performed by: Lavina Hamman, CRNA Pre-anesthesia Checklist: Patient identified, Emergency Drugs available, Suction available and Patient being monitored Patient Re-evaluated:Patient Re-evaluated prior to induction Oxygen Delivery Method: Circle System Utilized Preoxygenation: Pre-oxygenation with 100% oxygen Induction Type: IV induction Ventilation: Mask ventilation without difficulty LMA: LMA with gastric port inserted LMA Size: 4.0 Number of attempts: 1 Airway Equipment and Method: Bite block Placement Confirmation: positive ETCO2 Tube secured with: Tape Dental Injury: Teeth and Oropharynx as per pre-operative assessment

## 2019-02-15 NOTE — Anesthesia Preprocedure Evaluation (Signed)
Anesthesia Evaluation  Patient identified by MRN, date of birth, ID band Patient awake    Reviewed: Allergy & Precautions, H&P , NPO status , Patient's Chart, lab work & pertinent test results, reviewed documented beta blocker date and time   History of Anesthesia Complications Negative for: history of anesthetic complications  Airway Mallampati: II  TM Distance: >3 FB Neck ROM: Full    Dental no notable dental hx. (+) Upper Dentures, Lower Dentures   Pulmonary neg pulmonary ROS,    Pulmonary exam normal breath sounds clear to auscultation       Cardiovascular Exercise Tolerance: Good negative cardio ROS Normal cardiovascular exam Rhythm:regular Rate:Normal     Neuro/Psych PSYCHIATRIC DISORDERS Anxiety negative neurological ROS  negative psych ROS   GI/Hepatic negative GI ROS, Neg liver ROS,   Endo/Other  negative endocrine ROS  Renal/GU negative Renal ROS  negative genitourinary   Musculoskeletal  (+) Fibromyalgia -  Abdominal   Peds  Hematology negative hematology ROS (+)   Anesthesia Other Findings   Reproductive/Obstetrics negative OB ROS                             Anesthesia Physical  Anesthesia Plan  ASA: II  Anesthesia Plan: General   Post-op Pain Management:    Induction: Intravenous  PONV Risk Score and Plan: 3 and Ondansetron, Dexamethasone and Treatment may vary due to age or medical condition  Airway Management Planned: LMA  Additional Equipment: None  Intra-op Plan:   Post-operative Plan: Extubation in OR  Informed Consent: I have reviewed the patients History and Physical, chart, labs and discussed the procedure including the risks, benefits and alternatives for the proposed anesthesia with the patient or authorized representative who has indicated his/her understanding and acceptance.     Dental advisory given  Plan Discussed with: CRNA,  Anesthesiologist and Surgeon  Anesthesia Plan Comments:         Anesthesia Quick Evaluation

## 2019-02-16 ENCOUNTER — Encounter (HOSPITAL_COMMUNITY): Payer: Self-pay | Admitting: Urology

## 2019-10-22 IMAGING — CT CT RENAL STONE PROTOCOL
2 of 4 series · 16 of 46 positions shown, 18 images · non-contrast
Comparison: None.

CLINICAL DATA: Abdominal pain, flank pain

EXAM:
CT ABDOMEN AND PELVIS WITHOUT CONTRAST
TECHNIQUE: Multidetector CT imaging of the abdomen and pelvis was performed
following the standard protocol without IV contrast.

[Series 2: axial st · axial · 0.92mm/px · z∈[-508,-48]mm · 13 of 102 slices shown, 15 images]
[im 5/102  soft-tissue]
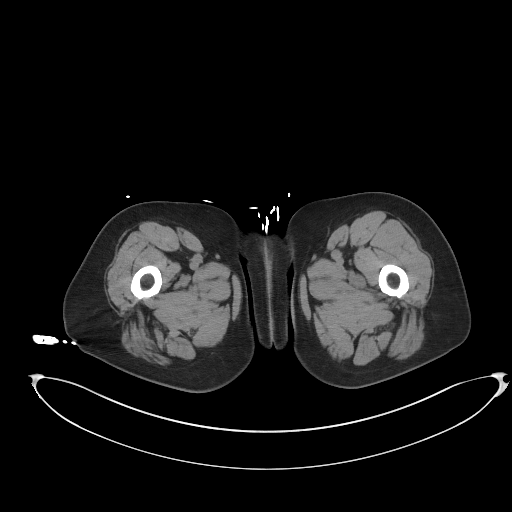
[im 5/102  bone]
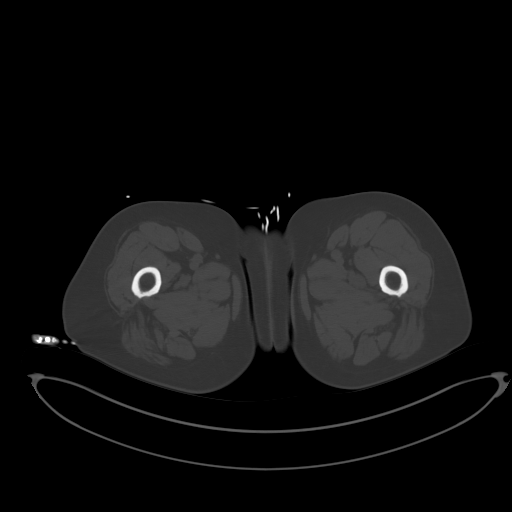
[im 13/102  soft-tissue]
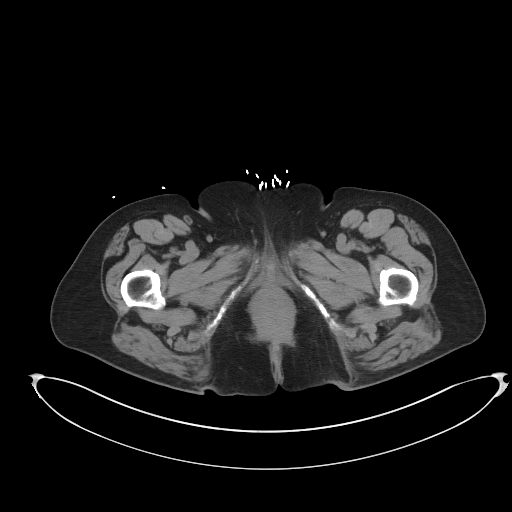
[im 21/102  soft-tissue]
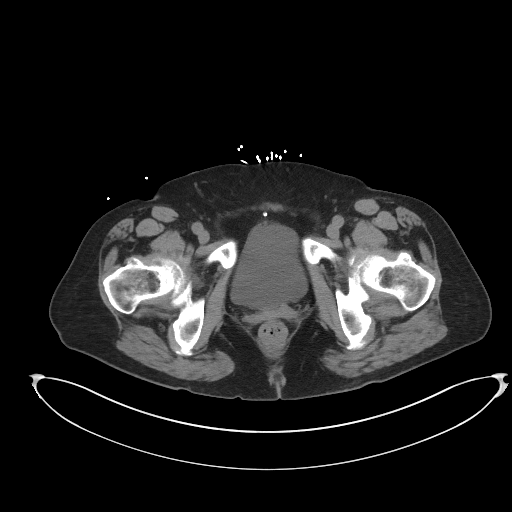
[im 29/102  soft-tissue]
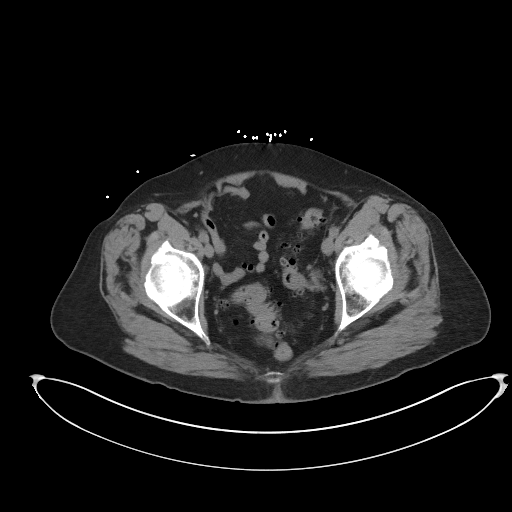
[im 37/102  soft-tissue]
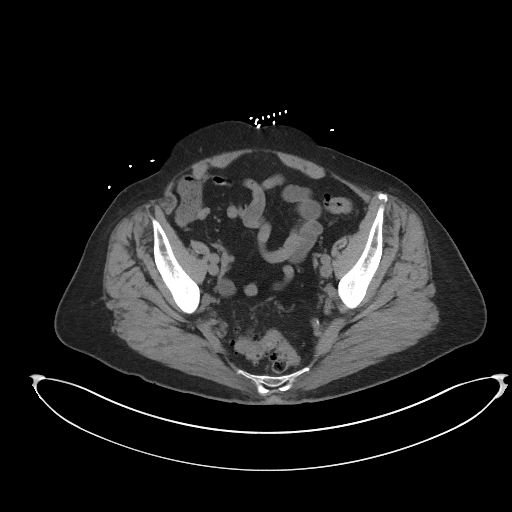
[im 45/102  soft-tissue]
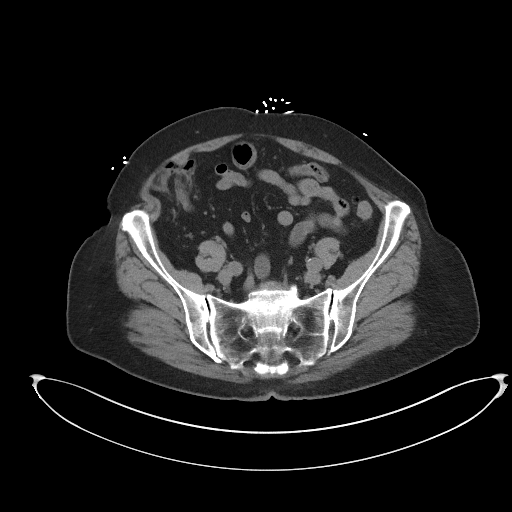
[im 53/102  soft-tissue]
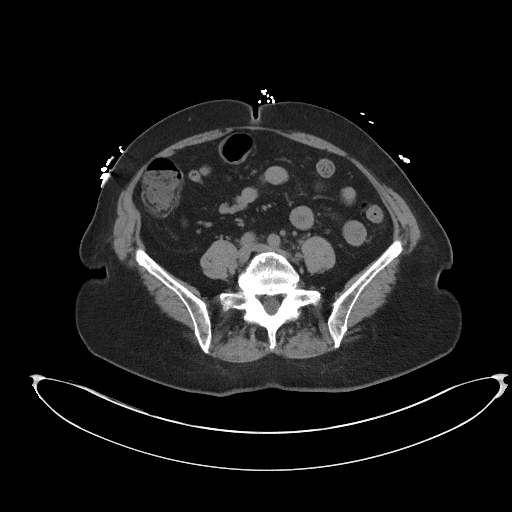
[im 57/102  soft-tissue]
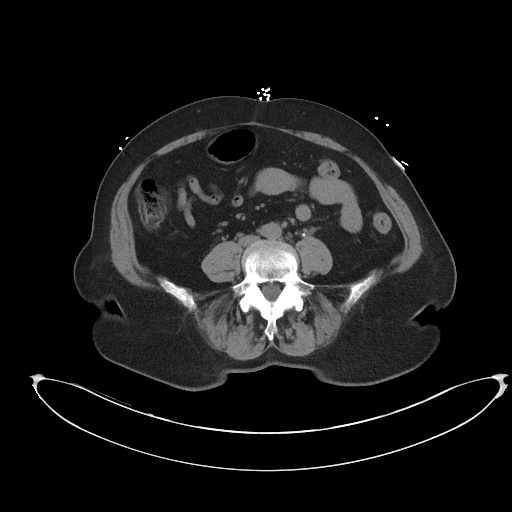
[im 65/102  soft-tissue]
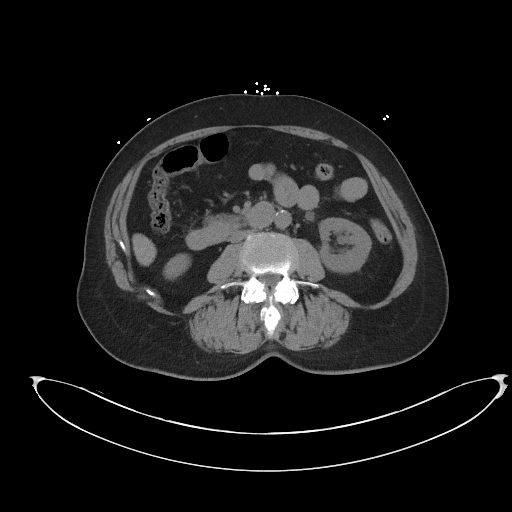
[im 65/102  bone]
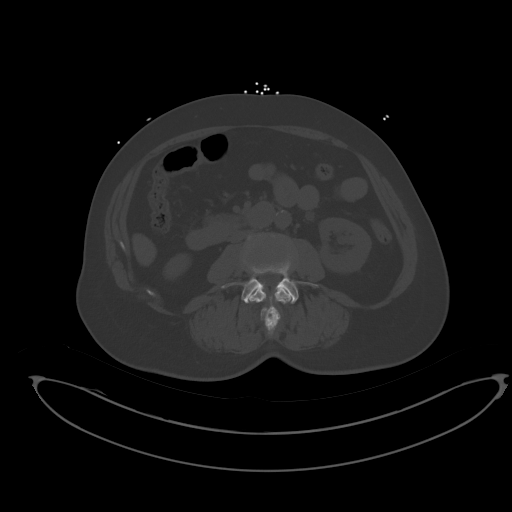
[im 73/102  soft-tissue]
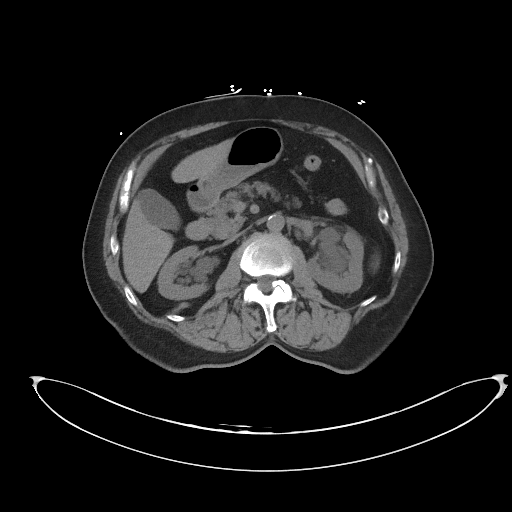
[im 81/102  soft-tissue]
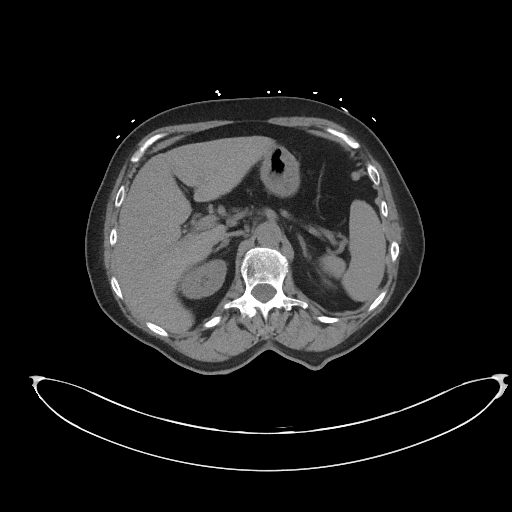
[im 89/102  soft-tissue]
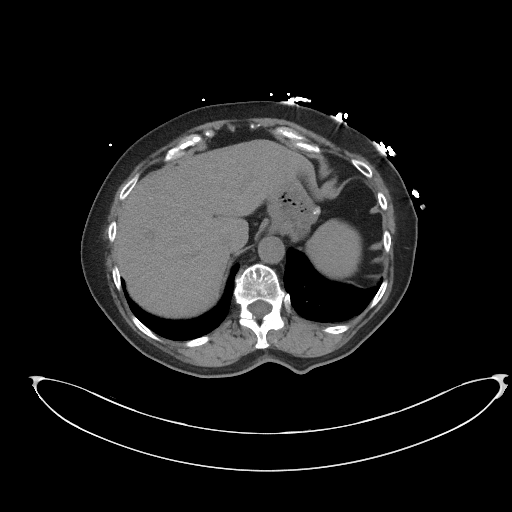
[im 97/102  soft-tissue]
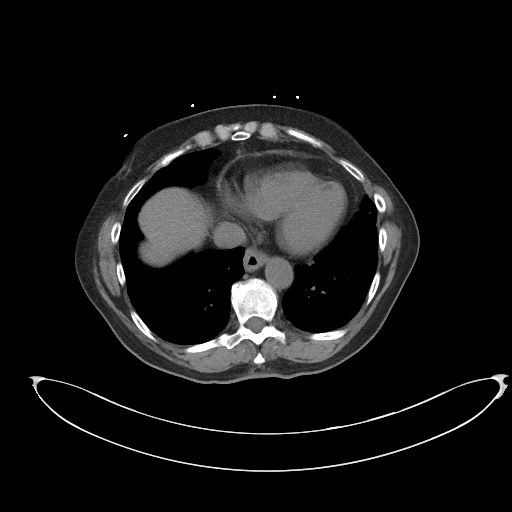

[Series 4: coronal st · coronal · 0.77mm/px · 3 of 101 slices shown]
[im 34/101  soft-tissue]
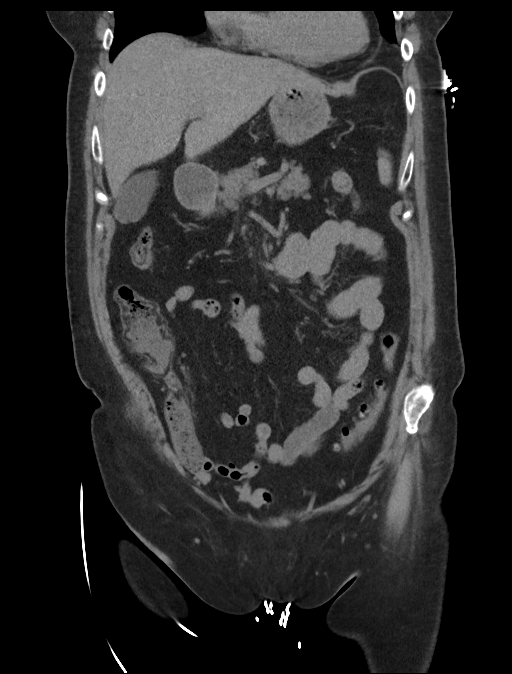
[im 45/101  soft-tissue]
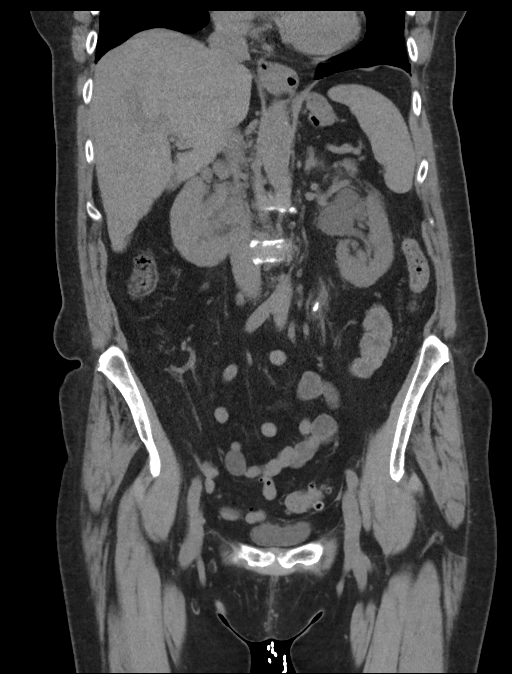
[im 56/101  soft-tissue]
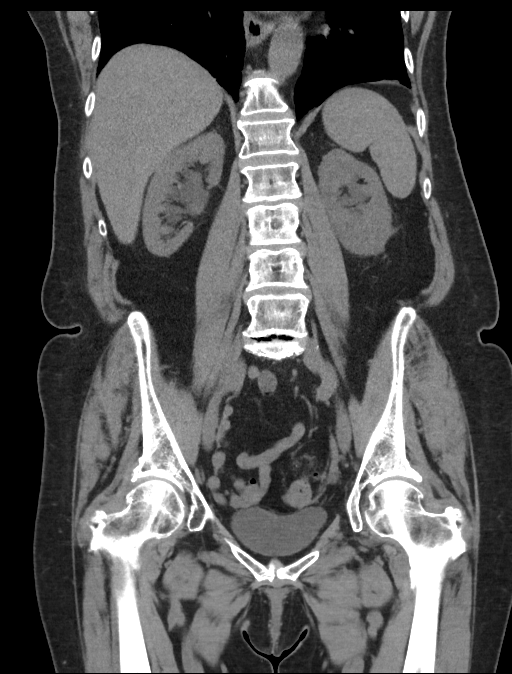

[16 of 46 positions shown; findings below may reference images not displayed]

FINDINGS: Lower chest: Lung bases are clear. No effusions. Heart is normal
size. Small hiatal hernia.

Hepatobiliary: No focal hepatic abnormality. Gallbladder
unremarkable.

Pancreas: No focal abnormality or ductal dilatation.

Spleen: No focal abnormality.  Normal size.

Adrenals/Urinary Tract: Moderate left hydronephrosis and perinephric
stranding due to 5 mm mid left ureteral stone. No stones or
hydronephrosis on the right. Right parapelvic cysts. Adrenal glands
and urinary bladder unremarkable.

Stomach/Bowel: Left colonic diverticulosis. No active
diverticulitis. Normal appendix. Stomach and small bowel
decompressed, unremarkable.

Vascular/Lymphatic: Aortic atherosclerosis. No enlarged abdominal or
pelvic lymph nodes.

Reproductive: Prior hysterectomy.  No adnexal masses.

Other: No free fluid or free air.

Musculoskeletal: No acute bony abnormality.
IMPRESSION: 5 mm mid left ureteral stone with moderate left hydronephrosis and
perinephric stranding.

Aortic atherosclerosis.

Small hiatal hernia.

Left colonic diverticulosis.

## 2020-10-02 IMAGING — US US EXTREM LOW VENOUS*R*
1 series · 13 of 24 positions shown · non-contrast
Comparison: None.

CLINICAL DATA: 83-year-old female with right calf pain. Recent
fracture of the right fibula.



[Series 1: us extrem low venous*right* · 0.08mm/px · 13 of 36 slices shown]
[im 1/36]
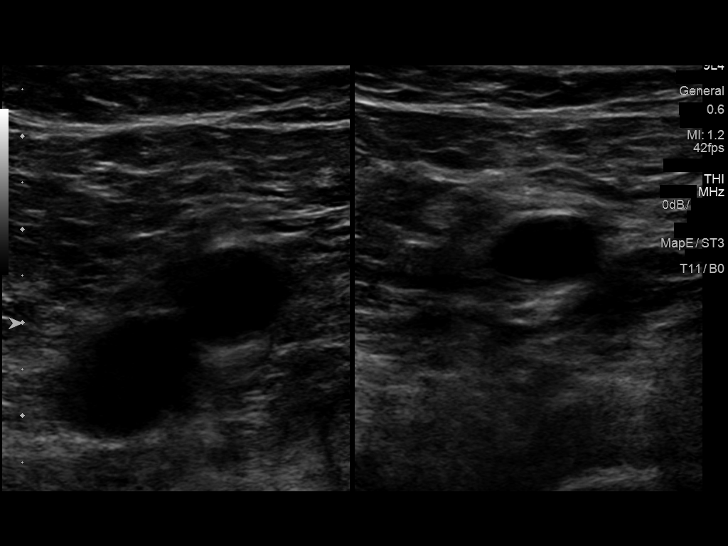
[im 4/36]
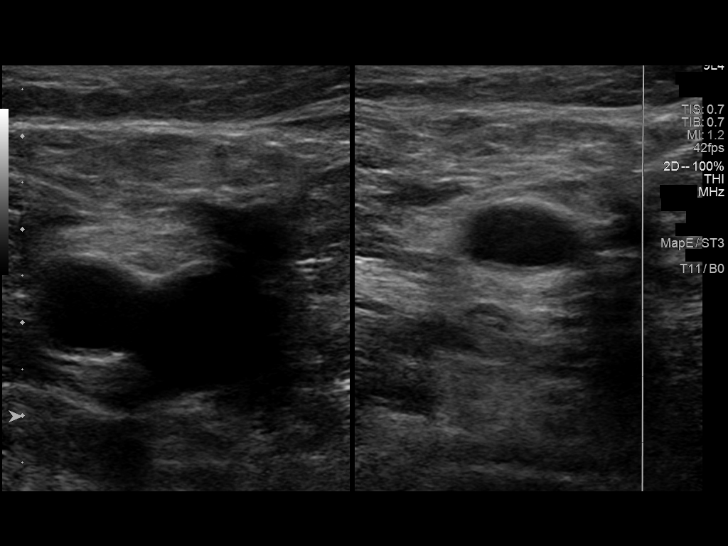
[im 7/36]
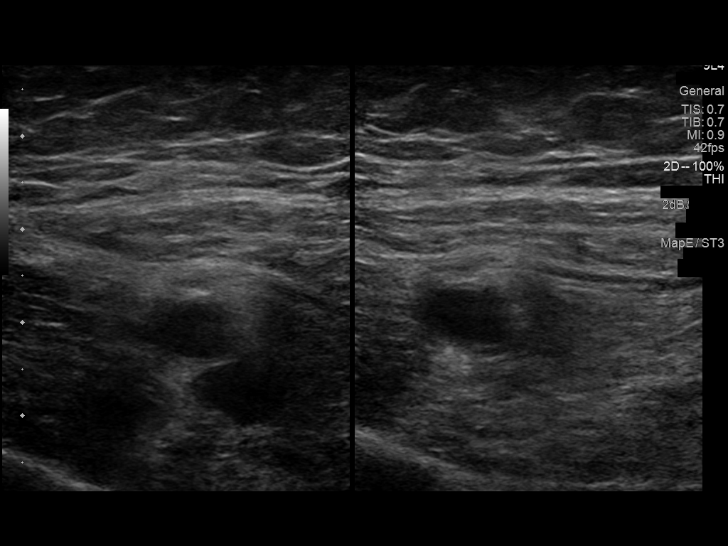
[im 10/36]
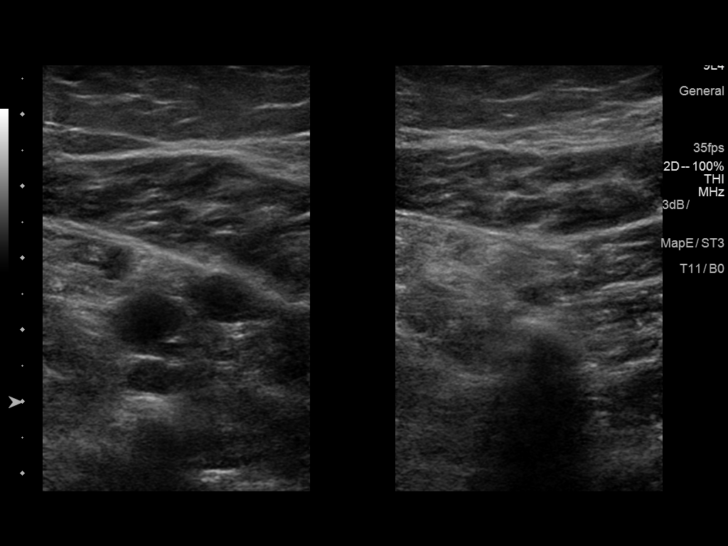
[im 13/36]
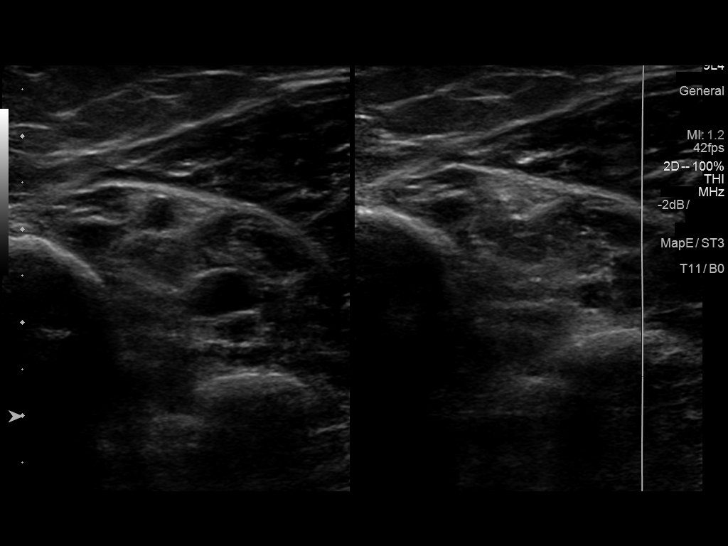
[im 16/36]
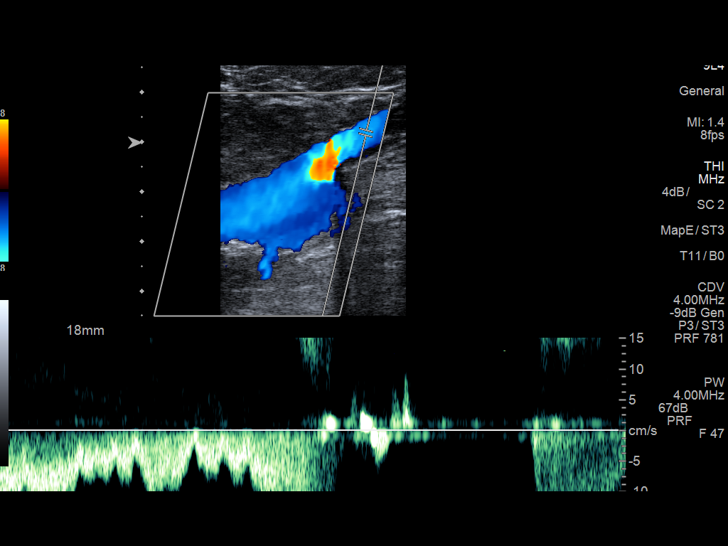
[im 19/36]
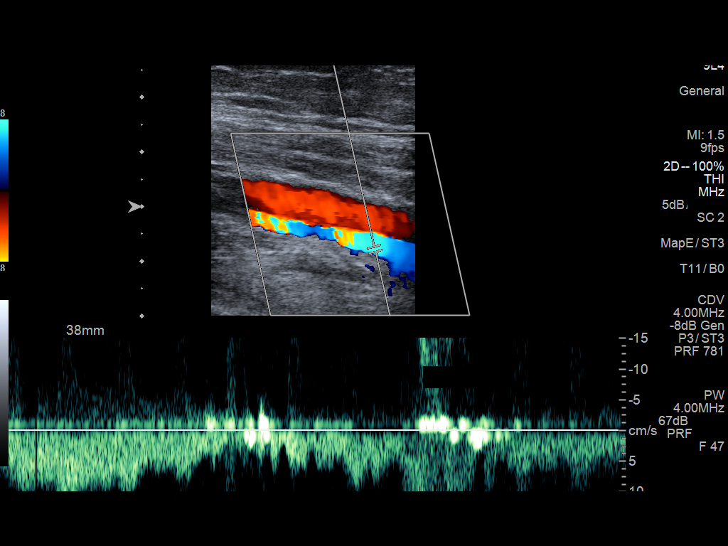
[im 20/36]
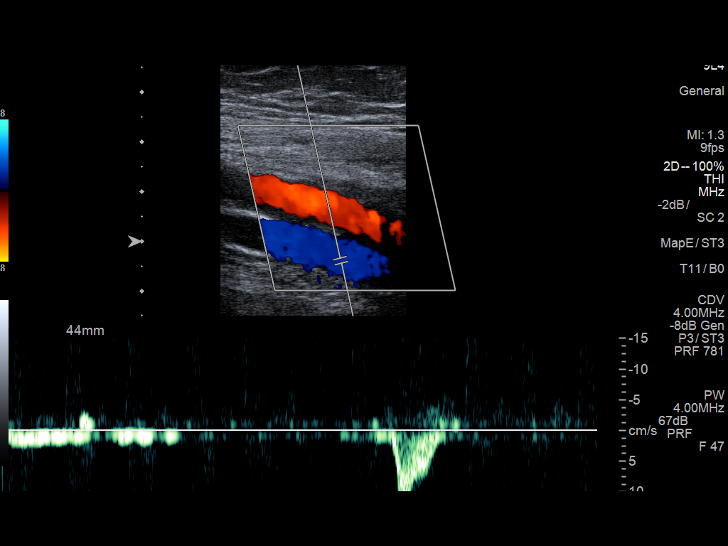
[im 23/36]
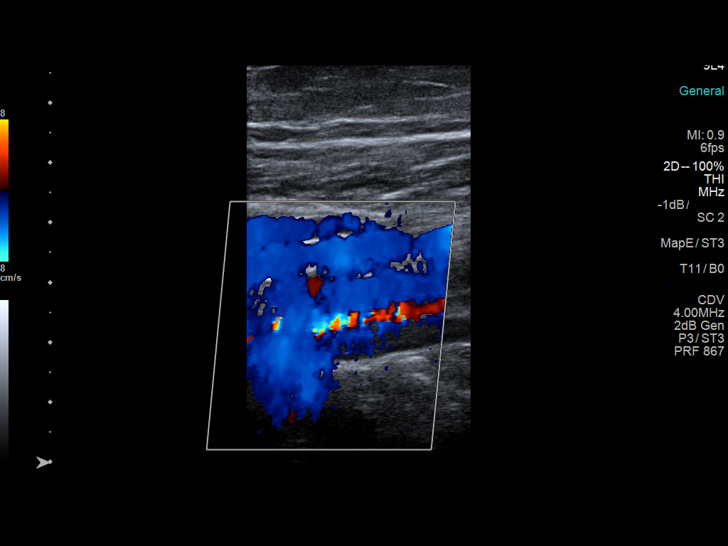
[im 26/36]
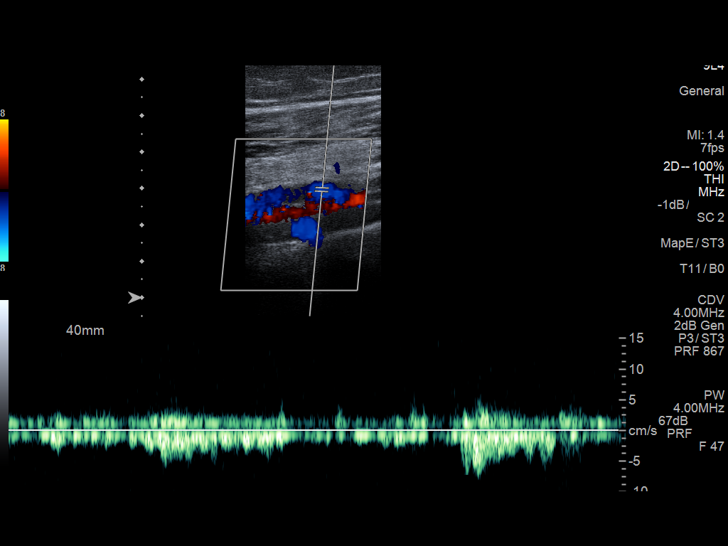
[im 29/36]
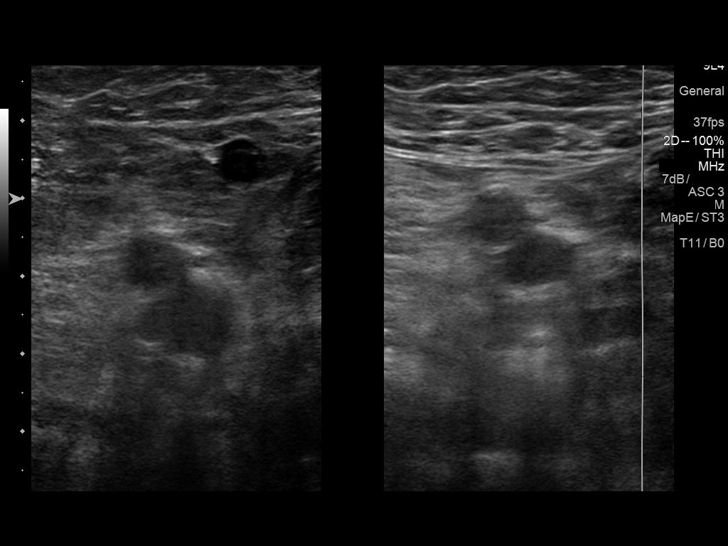
[im 32/36]
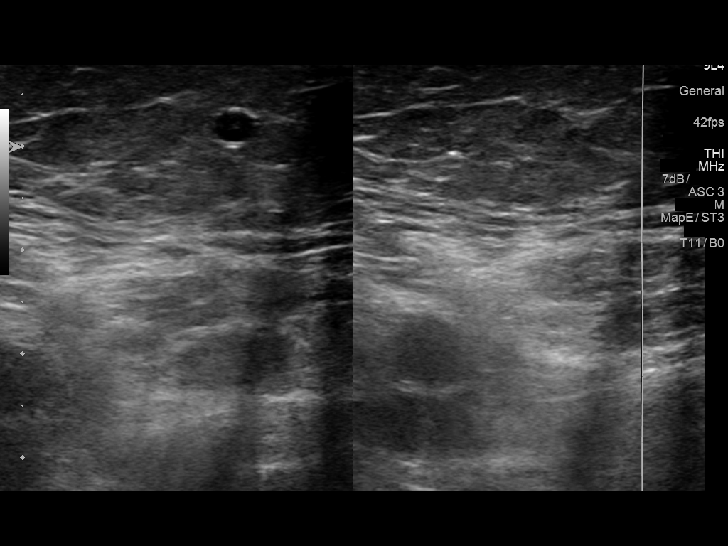
[im 36/36]
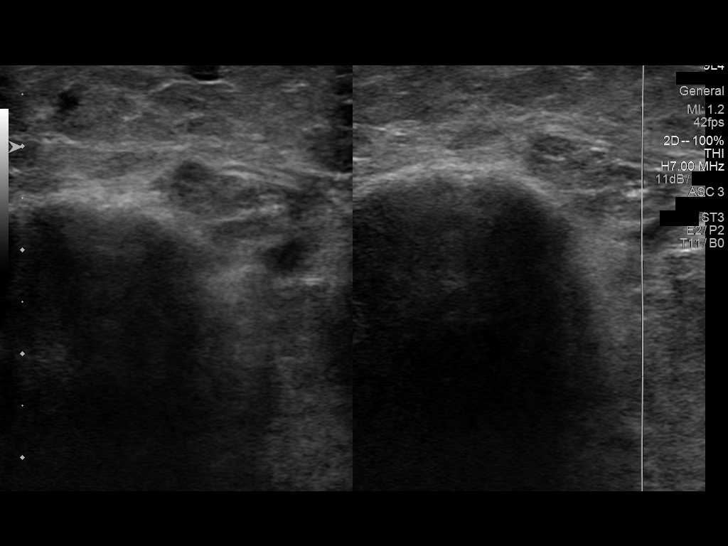

[13 of 24 positions shown; findings below may reference images not displayed]

FINDINGS: Contralateral Common Femoral Vein: Respiratory phasicity is normal
and symmetric with the symptomatic side. No evidence of thrombus.
Normal compressibility.

Common Femoral Vein: No evidence of thrombus. Normal
compressibility, respiratory phasicity and response to augmentation.

Saphenofemoral Junction: No evidence of thrombus. Normal
compressibility and flow on color Doppler imaging.

Profunda Femoral Vein: No evidence of thrombus. Normal
compressibility and flow on color Doppler imaging.

Femoral Vein: No evidence of thrombus. Normal compressibility,
respiratory phasicity and response to augmentation.

Popliteal Vein: No evidence of thrombus. Normal compressibility,
respiratory phasicity and response to augmentation.

Calf Veins: No evidence of thrombus. Normal compressibility and flow
on color Doppler imaging.

Superficial Great Saphenous Vein: No evidence of thrombus. Normal
compressibility.

Venous Reflux:  None.

Other Findings:  None.
IMPRESSION: No evidence of deep venous thrombosis.

## 2020-12-17 DIAGNOSIS — M150 Primary generalized (osteo)arthritis: Secondary | ICD-10-CM | POA: Diagnosis not present

## 2020-12-17 DIAGNOSIS — E7800 Pure hypercholesterolemia, unspecified: Secondary | ICD-10-CM | POA: Diagnosis not present

## 2021-01-17 DIAGNOSIS — E7800 Pure hypercholesterolemia, unspecified: Secondary | ICD-10-CM | POA: Diagnosis not present

## 2021-01-17 DIAGNOSIS — M150 Primary generalized (osteo)arthritis: Secondary | ICD-10-CM | POA: Diagnosis not present

## 2021-01-25 DIAGNOSIS — E7800 Pure hypercholesterolemia, unspecified: Secondary | ICD-10-CM | POA: Diagnosis not present

## 2021-01-25 DIAGNOSIS — M150 Primary generalized (osteo)arthritis: Secondary | ICD-10-CM | POA: Diagnosis not present

## 2021-03-07 DIAGNOSIS — R300 Dysuria: Secondary | ICD-10-CM | POA: Diagnosis not present

## 2021-03-07 DIAGNOSIS — N3010 Interstitial cystitis (chronic) without hematuria: Secondary | ICD-10-CM | POA: Diagnosis not present

## 2021-03-07 DIAGNOSIS — F5101 Primary insomnia: Secondary | ICD-10-CM | POA: Diagnosis not present

## 2021-03-07 DIAGNOSIS — M797 Fibromyalgia: Secondary | ICD-10-CM | POA: Diagnosis not present

## 2021-05-06 ENCOUNTER — Telehealth: Payer: Self-pay | Admitting: Radiology

## 2021-05-06 NOTE — Telephone Encounter (Signed)
FYI   Patient called triage line requesting appointment with Dr. Durward Fortes. She fell on Saturday and injured left arm. She has also had headaches since the injury, however, she does not think that she hit her head. I advised that Dr. Durward Fortes would want her to be checked by either her PCP, ED or Urgent Care if there is any concern for head injury. Patient would like to wait and see him tomorrow if possible. I worked patient in with Dr. Durward Fortes for her left arm tomorrow as she is having difficulty using it. Reiterated to patient to go to ED if condition/headache worsens. Patient expressed understanding.

## 2021-05-07 ENCOUNTER — Ambulatory Visit: Payer: Medicare Other | Admitting: Orthopaedic Surgery

## 2021-05-07 ENCOUNTER — Ambulatory Visit: Payer: Self-pay

## 2021-05-07 ENCOUNTER — Encounter: Payer: Self-pay | Admitting: Orthopaedic Surgery

## 2021-05-07 ENCOUNTER — Other Ambulatory Visit: Payer: Self-pay

## 2021-05-07 VITALS — Ht 68.0 in | Wt 170.0 lb

## 2021-05-07 DIAGNOSIS — M542 Cervicalgia: Secondary | ICD-10-CM | POA: Diagnosis not present

## 2021-05-07 DIAGNOSIS — M25512 Pain in left shoulder: Secondary | ICD-10-CM | POA: Diagnosis not present

## 2021-05-07 NOTE — Progress Notes (Signed)
Office Visit Note   Patient: Joyce Ray           Date of Birth: 1935-02-25           MRN: [id number] Visit Date: 05/07/2021              Requested by: Shirline Frees, MD [address],  [place] [address] PCP: Shirline Frees, MD   Assessment & Plan: Visit Diagnoses:  1. Acute pain of left shoulder   2. Neck pain     Plan: Joyce Ray fell 3 days ago landing on her left shoulder.  She has been experiencing neck pain and shoulder discomfort since that time.  She has not noticed any numbness or tingling or pain referred to either upper extremity but she is having a difficult time moving her neck and also her left nondominant shoulder.  X-rays demonstrated significant arthritis of the cervical spine but no obvious fracture.  There is no radiculopathy.  Movement of the left shoulder was uncomfortable as I could only flex about 45 degrees and abduct about the same.  The skin was intact and there was no ecchymosis.  Diffuse discomfort along the anterior aspect of the shoulder but no crepitation.  No specific pain over the distal clavicle.  No obvious fracture of the left shoulder but this only could be soft tissue injury i.e. rotator cuff tear.  Will place in a sling per her preference and reevaluate in 3 to 4 weeks consider follow-up films or MRI scan.  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  Does have some loss of motion but no referred pain or radiculopathy.  We will take over-the-counter medicines we will monitor that as well  Follow-Up Instructions: Return in about 1 month (around 06/07/2021).   Orders:  Orders Placed This Encounter  Procedures   XR Cervical Spine 2 or 3 views   XR Shoulder Left   No orders of the defined types were placed in this encounter.     Procedures: No procedures performed   Clinical Data: No additional findings.   Subjective: Chief Complaint  Patient presents with   Left Shoulder - Pain     DOI 05/04/2021  Patient presents today for left arm pain. She states that she fell on Saturday 05/04/2021 and landed on her the upper part of her left arm. She has been having pain throughout her shoulder and down her arm. She also has complaints of neck pain. No numbness or tingling. She cannot lift her arm or use it. She has been taking over the counter pain medicine. She is right hand dominant.  Has noted any ecchymosis.  HPI  Review of Systems   Objective: Vital Signs: Ht '5\' 8"'$  (1.727 m)   Wt 170 lb (77.1 kg)   BMI 25.85 kg/m   Physical Exam Constitutional:      Appearance: She is well-developed.  Pulmonary:     Effort: Pulmonary effort is normal.  Skin:    General: Skin is warm and dry.  Neurological:     Mental Status: She is alert and oriented to person, place, and time.  Psychiatric:        Behavior: Behavior normal.    Ortho Exam cervical spine motion is limited.  Able to touch her chin to her chest but only about 60% of neck extension.  Discomfort along the posterior cervical chain area but no referred pain to either shoulder or into the thoracic spine.  Had about 70%  of normal rotation to the right to the left.  Good grip and good release bilaterally.  No numbness.  Left shoulder with pain in most planes.  I was able to abduct and flex about 45degrees.  No ecchymosis.  No induration.  No crepitation. Specialty Comments:  No specialty comments available.  Imaging: XR Cervical Spine 2 or 3 views  Result Date: 05/07/2021 Films of the cervical spine were obtained in 2 projections.  There are diffuse degenerative changes throughout the cervical spine but particularly at C5-6 where there is narrowing of the disc space.  No obvious listhesis or fracture.  Diffuse facet sclerosis.  No curvature.  Cervical lordosis maintained  XR Shoulder Left  Result Date: 05/07/2021 Films of the left shoulder obtained in 3 projections.  There was no obvious fracture.  Humeral head is  centered in the glenoid.  Normal space between the humeral head and the acromion.  Degenerative changes at the acromioclavicular joint with bulky changes of the distal clavicle.  There are also osteophytes along the anterior acromion which could cause impingement.  No ectopic calcification    PMFS History: Patient Active Problem List   Diagnosis Date Noted   Neck pain 05/07/2021   Pain in left shoulder 05/07/2021   Past Medical History:  Diagnosis Date   Fibromyalgia    History of kidney stones     History reviewed. No pertinent family history.  Past Surgical History:  Procedure Laterality Date   ABDOMINAL HYSTERECTOMY     BREAST SURGERY     COLON SURGERY     CYSTOSCOPY WITH STENT PLACEMENT Left 02/04/2019   Procedure: CYSTOSCOPY WITH STENT PLACEMENT; LEFT RETROGRADE PYELOGRAM;  Surgeon: Lucas Mallow, MD;  Location: [facility];  Service: Urology;  Laterality: Left;   CYSTOSCOPY/URETEROSCOPY/HOLMIUM LASER/STENT PLACEMENT Left 02/15/2019   Procedure: CYSTOSCOPY STENT REMOVAL LEFT, URETEROSCOPY/LEFT  STENT PLACEMENT;  Surgeon: Irine Seal, MD;  Location: [facility];  Service: Urology;  Laterality: Left;   Social History   Occupational History   Not on file  Tobacco Use   Smoking status: Never   Smokeless tobacco: Never  Vaping Use   Vaping Use: Never used  Substance and Sexual Activity   Alcohol use: No   Drug use: No   Sexual activity: Not on file

## 2021-05-16 DIAGNOSIS — E7800 Pure hypercholesterolemia, unspecified: Secondary | ICD-10-CM | POA: Diagnosis not present

## 2021-05-16 DIAGNOSIS — M150 Primary generalized (osteo)arthritis: Secondary | ICD-10-CM | POA: Diagnosis not present

## 2021-05-23 DIAGNOSIS — M797 Fibromyalgia: Secondary | ICD-10-CM | POA: Diagnosis not present

## 2021-05-23 DIAGNOSIS — M25512 Pain in left shoulder: Secondary | ICD-10-CM | POA: Diagnosis not present

## 2021-07-12 DIAGNOSIS — E7800 Pure hypercholesterolemia, unspecified: Secondary | ICD-10-CM | POA: Diagnosis not present

## 2021-07-12 DIAGNOSIS — M150 Primary generalized (osteo)arthritis: Secondary | ICD-10-CM | POA: Diagnosis not present

## 2021-07-23 DIAGNOSIS — H04123 Dry eye syndrome of bilateral lacrimal glands: Secondary | ICD-10-CM | POA: Diagnosis not present

## 2021-07-25 DIAGNOSIS — R300 Dysuria: Secondary | ICD-10-CM | POA: Diagnosis not present

## 2021-09-30 DIAGNOSIS — E559 Vitamin D deficiency, unspecified: Secondary | ICD-10-CM | POA: Diagnosis not present

## 2021-09-30 DIAGNOSIS — M85851 Other specified disorders of bone density and structure, right thigh: Secondary | ICD-10-CM | POA: Diagnosis not present

## 2021-09-30 DIAGNOSIS — M797 Fibromyalgia: Secondary | ICD-10-CM | POA: Diagnosis not present

## 2021-09-30 DIAGNOSIS — E7800 Pure hypercholesterolemia, unspecified: Secondary | ICD-10-CM | POA: Diagnosis not present

## 2021-09-30 DIAGNOSIS — N3010 Interstitial cystitis (chronic) without hematuria: Secondary | ICD-10-CM | POA: Diagnosis not present

## 2021-09-30 DIAGNOSIS — Z0000 Encounter for general adult medical examination without abnormal findings: Secondary | ICD-10-CM | POA: Diagnosis not present

## 2021-09-30 DIAGNOSIS — M150 Primary generalized (osteo)arthritis: Secondary | ICD-10-CM | POA: Diagnosis not present

## 2021-09-30 DIAGNOSIS — F5101 Primary insomnia: Secondary | ICD-10-CM | POA: Diagnosis not present

## 2022-02-04 DIAGNOSIS — M5416 Radiculopathy, lumbar region: Secondary | ICD-10-CM | POA: Diagnosis not present

## 2022-02-12 DIAGNOSIS — C44311 Basal cell carcinoma of skin of nose: Secondary | ICD-10-CM | POA: Diagnosis not present

## 2022-02-24 DIAGNOSIS — R300 Dysuria: Secondary | ICD-10-CM | POA: Diagnosis not present

## 2022-03-12 DIAGNOSIS — Z08 Encounter for follow-up examination after completed treatment for malignant neoplasm: Secondary | ICD-10-CM | POA: Diagnosis not present

## 2022-03-12 DIAGNOSIS — Z85828 Personal history of other malignant neoplasm of skin: Secondary | ICD-10-CM | POA: Diagnosis not present

## 2022-03-31 DIAGNOSIS — E7800 Pure hypercholesterolemia, unspecified: Secondary | ICD-10-CM | POA: Diagnosis not present

## 2022-03-31 DIAGNOSIS — N3010 Interstitial cystitis (chronic) without hematuria: Secondary | ICD-10-CM | POA: Diagnosis not present

## 2022-03-31 DIAGNOSIS — H543 Unqualified visual loss, both eyes: Secondary | ICD-10-CM | POA: Diagnosis not present

## 2022-03-31 DIAGNOSIS — M150 Primary generalized (osteo)arthritis: Secondary | ICD-10-CM | POA: Diagnosis not present

## 2022-03-31 DIAGNOSIS — E559 Vitamin D deficiency, unspecified: Secondary | ICD-10-CM | POA: Diagnosis not present

## 2022-03-31 DIAGNOSIS — M797 Fibromyalgia: Secondary | ICD-10-CM | POA: Diagnosis not present

## 2022-03-31 DIAGNOSIS — F5101 Primary insomnia: Secondary | ICD-10-CM | POA: Diagnosis not present

## 2022-03-31 DIAGNOSIS — J0100 Acute maxillary sinusitis, unspecified: Secondary | ICD-10-CM | POA: Diagnosis not present

## 2022-04-02 ENCOUNTER — Encounter (INDEPENDENT_AMBULATORY_CARE_PROVIDER_SITE_OTHER): Payer: Self-pay

## 2022-04-02 DIAGNOSIS — Z961 Presence of intraocular lens: Secondary | ICD-10-CM | POA: Diagnosis not present

## 2022-04-02 DIAGNOSIS — H538 Other visual disturbances: Secondary | ICD-10-CM | POA: Diagnosis not present

## 2022-04-04 DIAGNOSIS — Z1159 Encounter for screening for other viral diseases: Secondary | ICD-10-CM | POA: Diagnosis not present

## 2022-04-04 DIAGNOSIS — H53413 Scotoma involving central area, bilateral: Secondary | ICD-10-CM | POA: Diagnosis not present

## 2022-04-09 ENCOUNTER — Encounter (INDEPENDENT_AMBULATORY_CARE_PROVIDER_SITE_OTHER): Payer: Medicare Other | Admitting: Ophthalmology

## 2022-04-29 DIAGNOSIS — H53413 Scotoma involving central area, bilateral: Secondary | ICD-10-CM | POA: Diagnosis not present

## 2022-04-29 DIAGNOSIS — I6782 Cerebral ischemia: Secondary | ICD-10-CM | POA: Diagnosis not present

## 2022-05-16 DIAGNOSIS — E538 Deficiency of other specified B group vitamins: Secondary | ICD-10-CM | POA: Diagnosis not present

## 2022-06-10 DIAGNOSIS — H53413 Scotoma involving central area, bilateral: Secondary | ICD-10-CM | POA: Diagnosis not present

## 2022-06-30 DIAGNOSIS — H53413 Scotoma involving central area, bilateral: Secondary | ICD-10-CM | POA: Diagnosis not present

## 2022-07-09 DIAGNOSIS — R0981 Nasal congestion: Secondary | ICD-10-CM | POA: Diagnosis not present

## 2022-07-09 DIAGNOSIS — R0602 Shortness of breath: Secondary | ICD-10-CM | POA: Diagnosis not present

## 2022-07-09 DIAGNOSIS — R519 Headache, unspecified: Secondary | ICD-10-CM | POA: Diagnosis not present

## 2022-07-09 DIAGNOSIS — J0111 Acute recurrent frontal sinusitis: Secondary | ICD-10-CM | POA: Diagnosis not present

## 2022-07-09 DIAGNOSIS — M797 Fibromyalgia: Secondary | ICD-10-CM | POA: Diagnosis not present

## 2022-07-09 DIAGNOSIS — N3010 Interstitial cystitis (chronic) without hematuria: Secondary | ICD-10-CM | POA: Diagnosis not present

## 2022-07-09 DIAGNOSIS — J3489 Other specified disorders of nose and nasal sinuses: Secondary | ICD-10-CM | POA: Diagnosis not present

## 2022-07-09 DIAGNOSIS — R051 Acute cough: Secondary | ICD-10-CM | POA: Diagnosis not present

## 2022-10-09 DIAGNOSIS — H538 Other visual disturbances: Secondary | ICD-10-CM | POA: Diagnosis not present

## 2022-10-09 DIAGNOSIS — Z961 Presence of intraocular lens: Secondary | ICD-10-CM | POA: Diagnosis not present

## 2022-10-17 DIAGNOSIS — M150 Primary generalized (osteo)arthritis: Secondary | ICD-10-CM | POA: Diagnosis not present

## 2022-10-17 DIAGNOSIS — N3010 Interstitial cystitis (chronic) without hematuria: Secondary | ICD-10-CM | POA: Diagnosis not present

## 2022-10-17 DIAGNOSIS — J0101 Acute recurrent maxillary sinusitis: Secondary | ICD-10-CM | POA: Diagnosis not present

## 2022-10-17 DIAGNOSIS — M797 Fibromyalgia: Secondary | ICD-10-CM | POA: Diagnosis not present

## 2022-10-17 DIAGNOSIS — E559 Vitamin D deficiency, unspecified: Secondary | ICD-10-CM | POA: Diagnosis not present

## 2022-10-17 DIAGNOSIS — E7800 Pure hypercholesterolemia, unspecified: Secondary | ICD-10-CM | POA: Diagnosis not present

## 2022-10-17 DIAGNOSIS — Z0000 Encounter for general adult medical examination without abnormal findings: Secondary | ICD-10-CM | POA: Diagnosis not present

## 2022-10-17 DIAGNOSIS — F5101 Primary insomnia: Secondary | ICD-10-CM | POA: Diagnosis not present

## 2022-10-17 DIAGNOSIS — H543 Unqualified visual loss, both eyes: Secondary | ICD-10-CM | POA: Diagnosis not present

## 2023-01-14 DIAGNOSIS — H30043 Focal chorioretinal inflammation, macular or paramacular, bilateral: Secondary | ICD-10-CM | POA: Diagnosis not present

## 2023-01-14 DIAGNOSIS — H35033 Hypertensive retinopathy, bilateral: Secondary | ICD-10-CM | POA: Diagnosis not present

## 2023-01-14 DIAGNOSIS — H43813 Vitreous degeneration, bilateral: Secondary | ICD-10-CM | POA: Diagnosis not present

## 2023-01-21 DIAGNOSIS — H30042 Focal chorioretinal inflammation, macular or paramacular, left eye: Secondary | ICD-10-CM | POA: Diagnosis not present

## 2023-02-03 DIAGNOSIS — Z08 Encounter for follow-up examination after completed treatment for malignant neoplasm: Secondary | ICD-10-CM | POA: Diagnosis not present

## 2023-02-03 DIAGNOSIS — L720 Epidermal cyst: Secondary | ICD-10-CM | POA: Diagnosis not present

## 2023-02-03 DIAGNOSIS — Z85820 Personal history of malignant melanoma of skin: Secondary | ICD-10-CM | POA: Diagnosis not present

## 2023-02-18 DIAGNOSIS — H30043 Focal chorioretinal inflammation, macular or paramacular, bilateral: Secondary | ICD-10-CM | POA: Diagnosis not present

## 2023-02-18 DIAGNOSIS — H43813 Vitreous degeneration, bilateral: Secondary | ICD-10-CM | POA: Diagnosis not present

## 2023-02-18 DIAGNOSIS — H35033 Hypertensive retinopathy, bilateral: Secondary | ICD-10-CM | POA: Diagnosis not present

## 2023-02-26 DIAGNOSIS — J019 Acute sinusitis, unspecified: Secondary | ICD-10-CM | POA: Diagnosis not present

## 2023-04-20 DIAGNOSIS — R051 Acute cough: Secondary | ICD-10-CM | POA: Diagnosis not present

## 2023-04-20 DIAGNOSIS — F5101 Primary insomnia: Secondary | ICD-10-CM | POA: Diagnosis not present

## 2023-04-20 DIAGNOSIS — M797 Fibromyalgia: Secondary | ICD-10-CM | POA: Diagnosis not present

## 2023-04-20 DIAGNOSIS — E7800 Pure hypercholesterolemia, unspecified: Secondary | ICD-10-CM | POA: Diagnosis not present

## 2023-04-21 DIAGNOSIS — H35033 Hypertensive retinopathy, bilateral: Secondary | ICD-10-CM | POA: Diagnosis not present

## 2023-04-21 DIAGNOSIS — H30043 Focal chorioretinal inflammation, macular or paramacular, bilateral: Secondary | ICD-10-CM | POA: Diagnosis not present

## 2023-04-21 DIAGNOSIS — H43813 Vitreous degeneration, bilateral: Secondary | ICD-10-CM | POA: Diagnosis not present

## 2023-04-21 DIAGNOSIS — H43392 Other vitreous opacities, left eye: Secondary | ICD-10-CM | POA: Diagnosis not present

## 2023-04-22 ENCOUNTER — Other Ambulatory Visit: Payer: Self-pay | Admitting: Family Medicine

## 2023-04-22 ENCOUNTER — Ambulatory Visit
Admission: RE | Admit: 2023-04-22 | Discharge: 2023-04-22 | Disposition: A | Discharge status: Home / Self Care | Payer: Medicare Other | Source: Ambulatory Visit | Attending: Family Medicine | Admitting: Family Medicine

## 2023-04-22 DIAGNOSIS — R051 Acute cough: Secondary | ICD-10-CM

## 2023-04-22 DIAGNOSIS — R059 Cough, unspecified: Secondary | ICD-10-CM | POA: Diagnosis not present

## 2023-10-28 DIAGNOSIS — M150 Primary generalized (osteo)arthritis: Secondary | ICD-10-CM | POA: Diagnosis not present

## 2023-10-28 DIAGNOSIS — R300 Dysuria: Secondary | ICD-10-CM | POA: Diagnosis not present

## 2023-10-28 DIAGNOSIS — J0100 Acute maxillary sinusitis, unspecified: Secondary | ICD-10-CM | POA: Diagnosis not present

## 2023-10-28 DIAGNOSIS — Z0000 Encounter for general adult medical examination without abnormal findings: Secondary | ICD-10-CM | POA: Diagnosis not present

## 2023-10-28 DIAGNOSIS — M797 Fibromyalgia: Secondary | ICD-10-CM | POA: Diagnosis not present

## 2023-10-28 DIAGNOSIS — F5101 Primary insomnia: Secondary | ICD-10-CM | POA: Diagnosis not present

## 2023-10-28 DIAGNOSIS — R0602 Shortness of breath: Secondary | ICD-10-CM | POA: Diagnosis not present

## 2023-10-28 DIAGNOSIS — R202 Paresthesia of skin: Secondary | ICD-10-CM | POA: Diagnosis not present

## 2023-10-28 DIAGNOSIS — E7800 Pure hypercholesterolemia, unspecified: Secondary | ICD-10-CM | POA: Diagnosis not present

## 2023-11-10 DIAGNOSIS — H30043 Focal chorioretinal inflammation, macular or paramacular, bilateral: Secondary | ICD-10-CM | POA: Diagnosis not present

## 2023-11-10 DIAGNOSIS — H43813 Vitreous degeneration, bilateral: Secondary | ICD-10-CM | POA: Diagnosis not present

## 2023-11-10 DIAGNOSIS — H35033 Hypertensive retinopathy, bilateral: Secondary | ICD-10-CM | POA: Diagnosis not present

## 2023-11-10 DIAGNOSIS — H43392 Other vitreous opacities, left eye: Secondary | ICD-10-CM | POA: Diagnosis not present

## 2023-11-20 DIAGNOSIS — R5383 Other fatigue: Secondary | ICD-10-CM | POA: Diagnosis not present

## 2023-11-20 DIAGNOSIS — R059 Cough, unspecified: Secondary | ICD-10-CM | POA: Diagnosis not present

## 2023-11-20 DIAGNOSIS — R42 Dizziness and giddiness: Secondary | ICD-10-CM | POA: Diagnosis not present

## 2023-11-20 DIAGNOSIS — R079 Chest pain, unspecified: Secondary | ICD-10-CM | POA: Diagnosis not present

## 2023-11-20 DIAGNOSIS — R531 Weakness: Secondary | ICD-10-CM | POA: Diagnosis not present

## 2023-11-20 DIAGNOSIS — R051 Acute cough: Secondary | ICD-10-CM | POA: Diagnosis not present

## 2023-11-24 DIAGNOSIS — M797 Fibromyalgia: Secondary | ICD-10-CM | POA: Diagnosis not present

## 2023-11-24 DIAGNOSIS — N3010 Interstitial cystitis (chronic) without hematuria: Secondary | ICD-10-CM | POA: Diagnosis not present

## 2023-11-24 DIAGNOSIS — J0101 Acute recurrent maxillary sinusitis: Secondary | ICD-10-CM | POA: Diagnosis not present

## 2023-12-24 DIAGNOSIS — J320 Chronic maxillary sinusitis: Secondary | ICD-10-CM | POA: Diagnosis not present

## 2024-01-19 ENCOUNTER — Ambulatory Visit: Admitting: Physician Assistant

## 2024-01-19 DIAGNOSIS — M65342 Trigger finger, left ring finger: Secondary | ICD-10-CM | POA: Diagnosis not present

## 2024-01-19 DIAGNOSIS — M65331 Trigger finger, right middle finger: Secondary | ICD-10-CM | POA: Diagnosis not present

## 2024-01-19 MED ORDER — METHYLPREDNISOLONE ACETATE 40 MG/ML IJ SUSP
13.3300 mg | INTRAMUSCULAR | Status: AC | PRN
  Administered 2024-01-19: 13.33 mg

## 2024-01-19 MED ORDER — BUPIVACAINE HCL 0.25 % IJ SOLN
0.3300 mL | INTRAMUSCULAR | Status: AC | PRN
  Administered 2024-01-19: .33 mL

## 2024-01-19 MED ORDER — LIDOCAINE HCL 1 % IJ SOLN
1.0000 mL | INTRAMUSCULAR | Status: AC | PRN
Start: 2024-01-19 — End: 2024-01-19
  Administered 2024-01-19: 1 mL

## 2024-01-19 MED ORDER — LIDOCAINE HCL 1 % IJ SOLN
1.0000 mL | INTRAMUSCULAR | Status: AC | PRN
  Administered 2024-01-19: 1 mL

## 2024-01-19 NOTE — Progress Notes (Signed)
 Office Visit Note   Patient: Joyce Ray           Date of Birth: 1935-04-25           MRN: [id number] Visit Date: 01/19/2024              Requested by: Roselind Congo, MD [phone] [place],  [place] [address] PCP: Roselind Congo, MD   Assessment & Plan: Visit Diagnoses:  1. Trigger finger, right middle finger   2. Trigger finger, left ring finger     Plan: Impression is right long and ring trigger fingers.  We have discussed various treatment options to include repeat cortisone injections today as well as night splinting versus possible surgical intervention in the future.  She would like to proceed with injections and night splints today.  We have also provided her with a night splint handout to obtain a product on [employer].  She will follow-up with us  as needed.  Call with concerns or questions.  Follow-Up Instructions: Return if symptoms worsen or fail to improve.   Orders:  Orders Placed This Encounter  Procedures   Hand/UE Inj: R long A1   Hand/UE Inj: R ring A1   No orders of the defined types were placed in this encounter.     Procedures: Hand/UE Inj: R long A1 for trigger finger on 01/19/2024 11:16 AM Indications: pain Details: 25 G needle Medications: 1 mL lidocaine  1 %; 0.33 mL bupivacaine 0.25 %; 13.33 mg methylPREDNISolone acetate 40 MG/ML   Hand/UE Inj: R ring A1 for trigger finger on 01/19/2024 11:16 AM Indications: pain Details: 25 G needle Medications: 1 mL lidocaine  1 %; 0.33 mL bupivacaine 0.25 %; 13.33 mg methylPREDNISolone acetate 40 MG/ML      Clinical Data: No additional findings.   Subjective: Chief Complaint  Patient presents with   Right Middle Finger - Pain   Left Middle Finger - Pain   Right Ring Finger - Pain    HPI patient is a pleasant 89 year old right-hand-dominant female previous patient of Dr. Langston Pippins who comes in today with recurrent trigger fingers to the right long and ring fingers.   Symptoms began years ago which improved with injections.  She has not been seen in our office since 2019.  Symptoms have returned.  She primarily complains of locking and pain into the proximal digits.  Review of Systems as detailed in HPI.  All others reviewed and are negative.   Objective: Vital Signs: There were no vitals taken for this visit.  Physical Exam well-developed well-nourished female in no acute distress.  Alert and oriented x 3.  Ortho Exam right hand exam: She does not have pain or palpable nodule A1 pulley of either the long or ring fingers.  She does have reproducible triggering of both these fingers.  She is neurovascularly intact distally.  Specialty Comments:  No specialty comments available.  Imaging: No new imaging   PMFS History: Patient Active Problem List   Diagnosis Date Noted   Neck pain 05/07/2021   Pain in left shoulder 05/07/2021   Past Medical History:  Diagnosis Date   Fibromyalgia    History of kidney stones     No family history on file.  Past Surgical History:  Procedure Laterality Date   ABDOMINAL HYSTERECTOMY     BREAST SURGERY     COLON SURGERY     CYSTOSCOPY WITH STENT PLACEMENT Left 02/04/2019   Procedure: CYSTOSCOPY WITH STENT PLACEMENT; LEFT RETROGRADE  PYELOGRAM;  Surgeon: Samson Croak, MD;  Location: [facility];  Service: Urology;  Laterality: Left;   CYSTOSCOPY/URETEROSCOPY/HOLMIUM LASER/STENT PLACEMENT Left 02/15/2019   Procedure: CYSTOSCOPY STENT REMOVAL LEFT, URETEROSCOPY/LEFT  STENT PLACEMENT;  Surgeon: Homero Luster, MD;  Location: [facility];  Service: Urology;  Laterality: Left;   Social History   Occupational History   Not on file  Tobacco Use   Smoking status: Never   Smokeless tobacco: Never  Vaping Use   Vaping status: Never Used  Substance and Sexual Activity   Alcohol use: No   Drug use: No   Sexual activity: Not on file

## 2024-01-26 ENCOUNTER — Ambulatory Visit: Payer: Medicare Other | Admitting: Cardiovascular Disease

## 2024-02-11 DIAGNOSIS — R051 Acute cough: Secondary | ICD-10-CM | POA: Diagnosis not present

## 2024-02-11 DIAGNOSIS — J019 Acute sinusitis, unspecified: Secondary | ICD-10-CM | POA: Diagnosis not present

## 2024-02-11 DIAGNOSIS — J029 Acute pharyngitis, unspecified: Secondary | ICD-10-CM | POA: Diagnosis not present

## 2024-02-11 DIAGNOSIS — M791 Myalgia, unspecified site: Secondary | ICD-10-CM | POA: Diagnosis not present

## 2024-03-21 DIAGNOSIS — M150 Primary generalized (osteo)arthritis: Secondary | ICD-10-CM | POA: Diagnosis not present

## 2024-03-21 DIAGNOSIS — E7800 Pure hypercholesterolemia, unspecified: Secondary | ICD-10-CM | POA: Diagnosis not present

## 2024-04-21 DIAGNOSIS — E7800 Pure hypercholesterolemia, unspecified: Secondary | ICD-10-CM | POA: Diagnosis not present

## 2024-04-21 DIAGNOSIS — M150 Primary generalized (osteo)arthritis: Secondary | ICD-10-CM | POA: Diagnosis not present

## 2024-04-29 DIAGNOSIS — E7800 Pure hypercholesterolemia, unspecified: Secondary | ICD-10-CM | POA: Diagnosis not present

## 2024-04-29 DIAGNOSIS — R42 Dizziness and giddiness: Secondary | ICD-10-CM | POA: Diagnosis not present

## 2024-04-29 DIAGNOSIS — M797 Fibromyalgia: Secondary | ICD-10-CM | POA: Diagnosis not present

## 2024-04-29 DIAGNOSIS — L820 Inflamed seborrheic keratosis: Secondary | ICD-10-CM | POA: Diagnosis not present

## 2024-04-29 DIAGNOSIS — R202 Paresthesia of skin: Secondary | ICD-10-CM | POA: Diagnosis not present

## 2024-04-29 DIAGNOSIS — M150 Primary generalized (osteo)arthritis: Secondary | ICD-10-CM | POA: Diagnosis not present

## 2024-04-29 DIAGNOSIS — H01005 Unspecified blepharitis left lower eyelid: Secondary | ICD-10-CM | POA: Diagnosis not present

## 2024-05-10 DIAGNOSIS — H43392 Other vitreous opacities, left eye: Secondary | ICD-10-CM | POA: Diagnosis not present

## 2024-05-10 DIAGNOSIS — H35033 Hypertensive retinopathy, bilateral: Secondary | ICD-10-CM | POA: Diagnosis not present

## 2024-05-10 DIAGNOSIS — H43813 Vitreous degeneration, bilateral: Secondary | ICD-10-CM | POA: Diagnosis not present

## 2024-05-10 DIAGNOSIS — H30043 Focal chorioretinal inflammation, macular or paramacular, bilateral: Secondary | ICD-10-CM | POA: Diagnosis not present

## 2024-05-22 DIAGNOSIS — M150 Primary generalized (osteo)arthritis: Secondary | ICD-10-CM | POA: Diagnosis not present

## 2024-05-22 DIAGNOSIS — E7800 Pure hypercholesterolemia, unspecified: Secondary | ICD-10-CM | POA: Diagnosis not present

## 2024-06-21 DIAGNOSIS — E7800 Pure hypercholesterolemia, unspecified: Secondary | ICD-10-CM | POA: Diagnosis not present

## 2024-06-21 DIAGNOSIS — M150 Primary generalized (osteo)arthritis: Secondary | ICD-10-CM | POA: Diagnosis not present

## 2024-06-30 DIAGNOSIS — H04332 Acute lacrimal canaliculitis of left lacrimal passage: Secondary | ICD-10-CM | POA: Diagnosis not present

## 2024-07-06 DIAGNOSIS — H04332 Acute lacrimal canaliculitis of left lacrimal passage: Secondary | ICD-10-CM | POA: Diagnosis not present

## 2024-07-07 DIAGNOSIS — H52223 Regular astigmatism, bilateral: Secondary | ICD-10-CM | POA: Diagnosis not present

## 2024-07-18 DIAGNOSIS — H04332 Acute lacrimal canaliculitis of left lacrimal passage: Secondary | ICD-10-CM | POA: Diagnosis not present

## 2024-07-25 ENCOUNTER — Encounter: Payer: Self-pay | Admitting: Radiology
